# Patient Record
Sex: Female | Born: 1955 | Race: White | Hispanic: No | Marital: Married | Smoking: Former smoker
Health system: Southern US, Community
[De-identification: ages and names within clinical notes are randomized; demographics above are authoritative.]

## PROBLEM LIST (undated history)

## (undated) DIAGNOSIS — E039 Hypothyroidism, unspecified: Secondary | ICD-10-CM

## (undated) DIAGNOSIS — D649 Anemia, unspecified: Secondary | ICD-10-CM

## (undated) DIAGNOSIS — Z8719 Personal history of other diseases of the digestive system: Secondary | ICD-10-CM

## (undated) DIAGNOSIS — K649 Unspecified hemorrhoids: Secondary | ICD-10-CM

## (undated) DIAGNOSIS — K219 Gastro-esophageal reflux disease without esophagitis: Secondary | ICD-10-CM

## (undated) DIAGNOSIS — L309 Dermatitis, unspecified: Secondary | ICD-10-CM

## (undated) DIAGNOSIS — G43909 Migraine, unspecified, not intractable, without status migrainosus: Secondary | ICD-10-CM

## (undated) HISTORY — PX: DILATION AND CURETTAGE OF UTERUS: SHX78

---

## 2014-12-12 ENCOUNTER — Emergency Department: Payer: Self-pay | Admitting: Emergency Medicine

## 2016-02-28 ENCOUNTER — Other Ambulatory Visit: Payer: Self-pay | Admitting: Obstetrics and Gynecology

## 2016-02-28 DIAGNOSIS — Z1231 Encounter for screening mammogram for malignant neoplasm of breast: Secondary | ICD-10-CM

## 2016-03-15 ENCOUNTER — Ambulatory Visit
Admission: RE | Admit: 2016-03-15 | Discharge: 2016-03-15 | Disposition: A | Discharge status: Home / Self Care | Payer: BC Managed Care – PPO | Source: Ambulatory Visit | Attending: Obstetrics and Gynecology | Admitting: Obstetrics and Gynecology

## 2016-03-15 DIAGNOSIS — Z1231 Encounter for screening mammogram for malignant neoplasm of breast: Secondary | ICD-10-CM | POA: Diagnosis not present

## 2016-04-06 ENCOUNTER — Other Ambulatory Visit: Payer: Self-pay | Admitting: Obstetrics and Gynecology

## 2016-04-06 DIAGNOSIS — N6459 Other signs and symptoms in breast: Secondary | ICD-10-CM

## 2016-04-06 DIAGNOSIS — N6489 Other specified disorders of breast: Secondary | ICD-10-CM

## 2016-04-14 ENCOUNTER — Ambulatory Visit
Admission: RE | Admit: 2016-04-14 | Discharge: 2016-04-14 | Disposition: A | Discharge status: Home / Self Care | Payer: BC Managed Care – PPO | Source: Ambulatory Visit | Attending: Obstetrics and Gynecology | Admitting: Obstetrics and Gynecology

## 2016-04-14 DIAGNOSIS — N6459 Other signs and symptoms in breast: Secondary | ICD-10-CM

## 2016-04-14 DIAGNOSIS — N6489 Other specified disorders of breast: Secondary | ICD-10-CM | POA: Diagnosis not present

## 2016-04-14 DIAGNOSIS — R6889 Other general symptoms and signs: Secondary | ICD-10-CM | POA: Insufficient documentation

## 2016-08-08 ENCOUNTER — Telehealth: Payer: Self-pay | Admitting: Gastroenterology

## 2016-08-08 NOTE — Telephone Encounter (Signed)
colonoscopy

## 2016-08-10 NOTE — Telephone Encounter (Signed)
Called patient and lvm

## 2016-08-10 NOTE — Telephone Encounter (Signed)
Patient will call me back tomorrow. She is aware that I will not be here. Please document the date that the patient chose and I will add in her orders when I get back

## 2016-08-11 ENCOUNTER — Telehealth: Payer: Self-pay | Admitting: Gastroenterology

## 2016-08-11 NOTE — Telephone Encounter (Signed)
Patient stated you were waiting for this info

## 2016-08-11 NOTE — Telephone Encounter (Signed)
Patient left a voice message to confirm colonoscopy appointment for 08/24/16

## 2016-08-11 NOTE — Telephone Encounter (Signed)
Pt called and stated that she would like to have procedure done on 08/24/16.

## 2016-08-14 ENCOUNTER — Other Ambulatory Visit: Payer: Self-pay

## 2016-08-14 NOTE — Telephone Encounter (Signed)
Screening Colonoscopy  08/24/2016 [facility] Dr. Lannie Fields Pre cert is not required

## 2016-08-14 NOTE — Telephone Encounter (Signed)
Colonoscopy has been scheduled. Will call patient to confirm

## 2016-08-14 NOTE — Telephone Encounter (Signed)
Gastroenterology Pre-Procedure Review  Request Date: 08/24/2016 Requesting Physician:  PATIENT REVIEW QUESTIONS: The patient responded to the following health history questions as indicated:    1. Are you having any GI issues? yes (Hemrroids) 2. Do you have a personal history of Polyps? yes (Unknown) 3. Do you have a family history of Colon Cancer or Polyps? no 4. Diabetes Mellitus? no 5. Joint replacements in the past 12 months?no 6. Major health problems in the past 3 months?no 7. Any artificial heart valves, MVP, or defibrillator?no    MEDICATIONS & ALLERGIES:    Patient reports the following regarding taking any anticoagulation/antiplatelet therapy:   Plavix, Coumadin, Eliquis, Xarelto, Lovenox, Pradaxa, Brilinta, or Effient? no Aspirin? no  Patient confirms/reports the following medications:  Current Outpatient Prescriptions  Medication Sig Dispense Refill  . ferrous sulfate 325 (65 FE) MG tablet TAKE 1 (ONE) TABLET BY MOUTH TWO TIMES DAILY  6  . levothyroxine (SYNTHROID, LEVOTHROID) 50 MCG tablet Take 50 mcg by mouth daily.  5   No current facility-administered medications for this visit.     Patient confirms/reports the following allergies:  Allergies  Allergen Reactions  . Latex Hives  . Penicillins Hives  . Sulfa Antibiotics Hives    No orders of the defined types were placed in this encounter.   AUTHORIZATION INFORMATION Primary Insurance: 1D#: Group #:  Secondary Insurance: 1D#: Group #:  SCHEDULE INFORMATION: Date: 08/24/2016 Time: Location: [facility]

## 2016-08-16 ENCOUNTER — Encounter: Payer: Self-pay | Admitting: *Deleted

## 2016-08-24 ENCOUNTER — Ambulatory Visit: Payer: BC Managed Care – PPO | Admitting: Anesthesiology

## 2016-08-24 ENCOUNTER — Ambulatory Visit
Admission: RE | Admit: 2016-08-24 | Discharge: 2016-08-24 | Disposition: A | Discharge status: Home / Self Care | Payer: BC Managed Care – PPO | Source: Ambulatory Visit | Attending: Gastroenterology | Admitting: Gastroenterology

## 2016-08-24 ENCOUNTER — Encounter
Admission: RE | Disposition: A | Discharge status: Home / Self Care | Payer: Self-pay | Source: Ambulatory Visit | Attending: Gastroenterology

## 2016-08-24 DIAGNOSIS — D124 Benign neoplasm of descending colon: Secondary | ICD-10-CM | POA: Diagnosis not present

## 2016-08-24 DIAGNOSIS — Z860100 Personal history of colon polyps, unspecified: Secondary | ICD-10-CM

## 2016-08-24 DIAGNOSIS — D125 Benign neoplasm of sigmoid colon: Secondary | ICD-10-CM | POA: Diagnosis not present

## 2016-08-24 DIAGNOSIS — K648 Other hemorrhoids: Secondary | ICD-10-CM | POA: Insufficient documentation

## 2016-08-24 DIAGNOSIS — Z1211 Encounter for screening for malignant neoplasm of colon: Secondary | ICD-10-CM | POA: Insufficient documentation

## 2016-08-24 DIAGNOSIS — E039 Hypothyroidism, unspecified: Secondary | ICD-10-CM | POA: Diagnosis not present

## 2016-08-24 DIAGNOSIS — K635 Polyp of colon: Secondary | ICD-10-CM

## 2016-08-24 HISTORY — PX: POLYPECTOMY: SHX5525

## 2016-08-24 HISTORY — DX: Personal history of other diseases of the digestive system: Z87.19

## 2016-08-24 HISTORY — DX: Dermatitis, unspecified: L30.9

## 2016-08-24 HISTORY — DX: Anemia, unspecified: D64.9

## 2016-08-24 HISTORY — PX: COLONOSCOPY WITH PROPOFOL: SHX5780

## 2016-08-24 HISTORY — DX: Migraine, unspecified, not intractable, without status migrainosus: G43.909

## 2016-08-24 SURGERY — COLONOSCOPY WITH PROPOFOL
Anesthesia: Monitor Anesthesia Care | Wound class: Contaminated

## 2016-08-24 MED ORDER — PROPOFOL 10 MG/ML IV BOLUS
INTRAVENOUS | Status: DC | PRN
  Administered 2016-08-24: 30 mg via INTRAVENOUS
  Administered 2016-08-24: 50 mg via INTRAVENOUS
  Administered 2016-08-24: 120 mg via INTRAVENOUS

## 2016-08-24 MED ORDER — ONDANSETRON HCL 4 MG/2ML IJ SOLN: 4.0000 mg | Freq: Once | INTRAMUSCULAR | Status: DC | PRN

## 2016-08-24 MED ORDER — LACTATED RINGERS IV SOLN
INTRAVENOUS | Status: DC
  Administered 2016-08-24: 09:00:00 via INTRAVENOUS

## 2016-08-24 MED ORDER — LIDOCAINE HCL (CARDIAC) 20 MG/ML IV SOLN
INTRAVENOUS | Status: DC | PRN
  Administered 2016-08-24: 50 mg via INTRAVENOUS

## 2016-08-24 MED ORDER — ACETAMINOPHEN 160 MG/5ML PO SOLN: 325.0000 mg | ORAL | Status: DC | PRN

## 2016-08-24 MED ORDER — ACETAMINOPHEN 325 MG PO TABS: 325.0000 mg | ORAL_TABLET | ORAL | Status: DC | PRN

## 2016-08-24 MED ORDER — STERILE WATER FOR IRRIGATION IR SOLN
Status: DC | PRN
  Administered 2016-08-24: 10:00:00

## 2016-08-24 SURGICAL SUPPLY — 6 items
CANISTER SUCT 1200ML W/VALVE (MISCELLANEOUS) ×4 IMPLANT
FORCEPS BIOP RAD 4 LRG CAP 4 (CUTTING FORCEPS) ×4 IMPLANT
GOWN CVR UNV OPN BCK APRN NK (MISCELLANEOUS) ×4 IMPLANT
GOWN ISOL THUMB LOOP REG UNIV (MISCELLANEOUS) ×4
KIT ENDO PROCEDURE OLY (KITS) ×4 IMPLANT
WATER STERILE IRR 250ML POUR (IV SOLUTION) ×4 IMPLANT

## 2016-08-24 NOTE — Discharge Instructions (Signed)

## 2016-08-24 NOTE — Op Note (Signed)
[facility] Gastroenterology Patient Name: Barbara Walter Procedure Date: 08/24/2016 9:56 AM MRN: [id number] Account #: [id number] Date of Birth: 1956/04/24 Admit Type: Outpatient Age: 60 Room: [facility] OR ROOM 01 Gender: Female Note Status: Finalized Procedure:            Colonoscopy Indications:          High risk colon cancer surveillance: Personal history                        of colonic polyps Providers:            Lucilla Lame MD, MD Medicines:            Propofol per Anesthesia Complications:        No immediate complications. Procedure:            Pre-Anesthesia Assessment:                       - Prior to the procedure, a History and Physical was                        performed, and patient medications and allergies were                        reviewed. The patient's tolerance of previous                        anesthesia was also reviewed. The risks and benefits of                        the procedure and the sedation options and risks were                        discussed with the patient. All questions were                        answered, and informed consent was obtained. Prior                        Anticoagulants: The patient has taken no previous                        anticoagulant or antiplatelet agents. ASA Grade                        Assessment: II - A patient with mild systemic disease.                        After reviewing the risks and benefits, the patient was                        deemed in satisfactory condition to undergo the                        procedure.                       After obtaining informed consent, the colonoscope was                        passed under direct vision. Throughout the  procedure,                        the patient's blood pressure, pulse, and oxygen                        saturations were monitored continuously. The was                        introduced through the anus and advanced to the the          cecum, identified by appendiceal orifice and ileocecal                        valve. The colonoscopy was performed without                        difficulty. The patient tolerated the procedure well.                        The quality of the bowel preparation was excellent. Findings:      The perianal and digital rectal examinations were normal.      A 3 mm polyp was found in the descending colon. The polyp was sessile.       The polyp was removed with a cold biopsy forceps. Resection and       retrieval were complete.      A 3 mm polyp was found in the sigmoid colon. The polyp was sessile. The       polyp was removed with a cold biopsy forceps. Resection and retrieval       were complete.      Non-bleeding internal hemorrhoids were found during retroflexion. The       hemorrhoids were Grade I (internal hemorrhoids that do not prolapse). Impression:           - One 3 mm polyp in the descending colon, removed with                        a cold biopsy forceps. Resected and retrieved.                       - One 3 mm polyp in the sigmoid colon, removed with a                        cold biopsy forceps. Resected and retrieved.                       - Non-bleeding internal hemorrhoids. Recommendation:       - Discharge patient to home.                       - Resume previous diet.                       - Await pathology results.                       - Repeat colonoscopy in 5 years for surveillance. Procedure Code(s):    --- Professional ---                       [phone], Colonoscopy, flexible; with  biopsy, single or                        multiple Diagnosis Code(s):    --- Professional ---                       Z86.010, Personal history of colonic polyps                       D12.4, Benign neoplasm of descending colon                       D12.5, Benign neoplasm of sigmoid colon CPT copyright 2016 American Medical Association. All rights reserved. The codes documented in this report  are preliminary and upon coder review may  be revised to meet current compliance requirements. Lucilla Lame MD, MD 08/24/2016 10:22:44 AM This report has been signed electronically. Number of Addenda: 0 Note Initiated On: 08/24/2016 9:56 AM Scope Withdrawal Time: 0 hours 7 minutes 47 seconds  Total Procedure Duration: 0 hours 10 minutes 46 seconds       [facility]

## 2016-08-24 NOTE — Transfer of Care (Signed)
Immediate Anesthesia Transfer of Care Note  Patient: Kynslie Zuch  Procedure(s) Performed: Procedure(s) with comments: COLONOSCOPY WITH PROPOFOL (N/A) - Latex sensitivity POLYPECTOMY  Patient Location: PACU  Anesthesia Type: MAC  Level of Consciousness: awake, alert  and patient cooperative  Airway and Oxygen Therapy: Patient Spontanous Breathing and Patient connected to supplemental oxygen  Post-op Assessment: Post-op Vital signs reviewed, Patient's Cardiovascular Status Stable, Respiratory Function Stable, Patent Airway and No signs of Nausea or vomiting  Post-op Vital Signs: Reviewed and stable  Complications: No apparent anesthesia complications

## 2016-08-24 NOTE — Anesthesia Postprocedure Evaluation (Signed)
Anesthesia Post Note  Patient: Barbara Walter  Procedure(s) Performed: Procedure(s) (LRB): COLONOSCOPY WITH PROPOFOL (N/A) POLYPECTOMY  Patient location during evaluation: PACU Anesthesia Type: MAC Level of consciousness: awake and alert and oriented Pain management: pain level controlled Vital Signs Assessment: post-procedure vital signs reviewed and stable Respiratory status: spontaneous breathing and nonlabored ventilation Cardiovascular status: stable Postop Assessment: no signs of nausea or vomiting and adequate PO intake Anesthetic complications: no    Estill Batten

## 2016-08-24 NOTE — H&P (Signed)
  Lucilla Lame, MD [facility] [address], [place], [place] [address] Phone: [phone] Fax : [phone]  Primary Care Physician:  No primary care provider on file. Primary Gastroenterologist:  Dr. Allen Norris  Pre-Procedure History & Physical: HPI:  Barbara Walter is a 60 y.o. female is here for an colonoscopy.   Past Medical History:  Diagnosis Date  . Anemia   . Eczema   . History of hiatal hernia   . Migraines    food and stress induced    Past Surgical History:  Procedure Laterality Date  . DILATION AND CURETTAGE OF UTERUS      Prior to Admission medications   Medication Sig Start Date End Date Taking? Authorizing Provider  cetirizine (ZYRTEC) 10 MG tablet Take 10 mg by mouth daily.   Yes Historical Provider, MD  ferrous sulfate 325 (65 FE) MG tablet TAKE 1 (ONE) TABLET BY MOUTH TWO TIMES DAILY 08/04/16  Yes Historical Provider, MD  levothyroxine (SYNTHROID, LEVOTHROID) 50 MCG tablet Take 50 mcg by mouth daily. 08/04/16  Yes Historical Provider, MD    Allergies as of 08/14/2016 - never reviewed  Allergen Reaction Noted  . Latex Hives 08/10/2016  . Penicillins Hives 08/10/2016  . Sulfa antibiotics Hives 08/10/2016    Family History  Problem Relation Age of Onset  . Breast cancer Maternal Grandmother     Social History   Social History  . Marital status: Unknown    Spouse name: N/A  . Number of children: N/A  . Years of education: N/A   Occupational History  . Not on file.   Social History Main Topics  . Smoking status: Never Smoker  . Smokeless tobacco: Never Used  . Alcohol use No  . Drug use: Unknown  . Sexual activity: Not on file   Other Topics Concern  . Not on file   Social History Narrative  . No narrative on file    Review of Systems: See HPI, otherwise negative ROS  Physical Exam: BP 122/82   Pulse 77   Temp 97.3 F (36.3 C) (Tympanic)   Resp 16   Ht 5\' 4"  (1.626 m)   Wt 153 lb (69.4 kg)   SpO2 98%   BMI 26.26 kg/m  General:    Alert,  pleasant and cooperative in NAD Head:  Normocephalic and atraumatic. Neck:  Supple; no masses or thyromegaly. Lungs:  Clear throughout to auscultation.    Heart:  Regular rate and rhythm. Abdomen:  Soft, nontender and nondistended. Normal bowel sounds, without guarding, and without rebound.   Neurologic:  Alert and  oriented x4;  grossly normal neurologically.  Impression/Plan: Barbara Walter is here for an colonoscopy to be performed for history of colon polyps  Risks, benefits, limitations, and alternatives regarding  colonoscopy have been reviewed with the patient.  Questions have been answered.  All parties agreeable.   Lucilla Lame, MD  08/24/2016, 9:58 AM

## 2016-08-24 NOTE — Anesthesia Preprocedure Evaluation (Addendum)
Anesthesia Evaluation  Patient identified by MRN, date of birth, ID band Patient awake    Reviewed: Allergy & Precautions, NPO status , Patient's Chart, lab work & pertinent test results  Airway Mallampati: I  TM Distance: >3 FB Neck ROM: Full    Dental no notable dental hx.    Pulmonary neg pulmonary ROS,    Pulmonary exam normal        Cardiovascular negative cardio ROS Normal cardiovascular exam     Neuro/Psych  Headaches, negative psych ROS   GI/Hepatic Neg liver ROS, hiatal hernia,   Endo/Other  Hypothyroidism   Renal/GU negative Renal ROS     Musculoskeletal negative musculoskeletal ROS (+)   Abdominal   Peds  Hematology negative hematology ROS (+)   Anesthesia Other Findings   Reproductive/Obstetrics                            Anesthesia Physical Anesthesia Plan  ASA: II  Anesthesia Plan: MAC   Post-op Pain Management:    Induction: Intravenous  Airway Management Planned:   Additional Equipment:   Intra-op Plan:   Post-operative Plan:   Informed Consent: I have reviewed the patients History and Physical, chart, labs and discussed the procedure including the risks, benefits and alternatives for the proposed anesthesia with the patient or authorized representative who has indicated his/her understanding and acceptance.     Plan Discussed with: CRNA  Anesthesia Plan Comments:         Anesthesia Quick Evaluation

## 2016-08-24 NOTE — Anesthesia Procedure Notes (Signed)
Procedure Name: MAC Date/Time: 08/24/2016 10:04 AM Performed by: Janna Arch Pre-anesthesia Checklist: Patient identified, Emergency Drugs available, Suction available and Patient being monitored Patient Re-evaluated:Patient Re-evaluated prior to inductionOxygen Delivery Method: Nasal cannula

## 2016-08-25 ENCOUNTER — Encounter: Payer: Self-pay | Admitting: Gastroenterology

## 2016-08-31 ENCOUNTER — Encounter: Payer: Self-pay | Admitting: Gastroenterology

## 2016-09-03 ENCOUNTER — Encounter: Payer: Self-pay | Admitting: Gastroenterology

## 2016-11-08 ENCOUNTER — Ambulatory Visit (INDEPENDENT_AMBULATORY_CARE_PROVIDER_SITE_OTHER): Payer: BC Managed Care – PPO

## 2016-11-08 ENCOUNTER — Encounter: Payer: Self-pay | Admitting: Podiatry

## 2016-11-08 ENCOUNTER — Ambulatory Visit (INDEPENDENT_AMBULATORY_CARE_PROVIDER_SITE_OTHER): Payer: BC Managed Care – PPO | Admitting: Podiatry

## 2016-11-08 VITALS — BP 129/86 | HR 73 | Resp 16

## 2016-11-08 DIAGNOSIS — M79672 Pain in left foot: Secondary | ICD-10-CM

## 2016-11-08 DIAGNOSIS — M7752 Other enthesopathy of left foot: Secondary | ICD-10-CM

## 2016-11-08 DIAGNOSIS — M84375A Stress fracture, left foot, initial encounter for fracture: Secondary | ICD-10-CM

## 2016-11-08 DIAGNOSIS — M779 Enthesopathy, unspecified: Secondary | ICD-10-CM

## 2016-11-08 DIAGNOSIS — M778 Other enthesopathies, not elsewhere classified: Secondary | ICD-10-CM

## 2016-11-08 NOTE — Progress Notes (Signed)
   Subjective:    Patient ID: Barbara Walter, female    DOB: 1956-05-01, 61 y.o.   MRN: [id number]  HPI: She presents today with a chief complaint of pain to the dorsal lateral aspect of the left foot 2 months. she states that she remembers stepping down hard on the foot while she was walking her new copy. She states that she's had some swelling and it aches from time to time she states that it doesn't hurt all the time nor doesn't hurt every day. She states that it feels best in her snow boots.    Review of Systems  Skin: Positive for rash.  Allergic/Immunologic: Positive for food allergies.  All other systems reviewed and are negative.      Objective:   Physical Exam: Vital signs are stable alert and oriented 3. Pulses are palpable. Neurologic sensorium is intact. Deep tendon reflexes are intact. She has pain on palpation of the base of the fifth metatarsal. There is a small ridge that runs cross dorsal aspect of the fifth metatarsal on palpation not visible on radiograph. She states this is the point tenderness. Radiographs do not demonstrate any type of fracture or dislocation.        Assessment & Plan:  Probable fifth metatarsal stress fracture slowly healing cells as capsulitis of that joint.  Plan: I injected the area today with eczema as a local anesthetic since assessment 2 months and she still has some tenderness. Follow-up with her as needed.

## 2017-03-13 ENCOUNTER — Ambulatory Visit: Payer: BC Managed Care – PPO | Admitting: Obstetrics and Gynecology

## 2017-05-16 ENCOUNTER — Ambulatory Visit (INDEPENDENT_AMBULATORY_CARE_PROVIDER_SITE_OTHER): Payer: BC Managed Care – PPO | Admitting: Obstetrics and Gynecology

## 2017-05-16 ENCOUNTER — Encounter: Payer: Self-pay | Admitting: Obstetrics and Gynecology

## 2017-05-16 VITALS — BP 132/80 | HR 73 | Ht 64.0 in | Wt 153.0 lb

## 2017-05-16 DIAGNOSIS — Z1231 Encounter for screening mammogram for malignant neoplasm of breast: Secondary | ICD-10-CM | POA: Diagnosis not present

## 2017-05-16 DIAGNOSIS — N9419 Other specified dyspareunia: Secondary | ICD-10-CM

## 2017-05-16 DIAGNOSIS — Z01419 Encounter for gynecological examination (general) (routine) without abnormal findings: Secondary | ICD-10-CM

## 2017-05-16 DIAGNOSIS — Z124 Encounter for screening for malignant neoplasm of cervix: Secondary | ICD-10-CM | POA: Diagnosis not present

## 2017-05-16 DIAGNOSIS — Z1239 Encounter for other screening for malignant neoplasm of breast: Secondary | ICD-10-CM

## 2017-05-16 LAB — HM PAP SMEAR: HM Pap smear: NORMAL

## 2017-05-16 MED ORDER — PRASTERONE 6.5 MG VA INST: 6.5000 mg | VAGINAL_INSERT | Freq: Every day | VAGINAL | 11 refills | Status: DC

## 2017-05-16 NOTE — Patient Instructions (Signed)
Preventive Care 40-64 Years, Female Preventive care refers to lifestyle choices and visits with your health care provider that can promote health and wellness. What does preventive care include?  A yearly physical exam. This is also called an annual well check.  Dental exams once or twice a year.  Routine eye exams. Ask your health care provider how often you should have your eyes checked.  Personal lifestyle choices, including: ? Daily care of your teeth and gums. ? Regular physical activity. ? Eating a healthy diet. ? Avoiding tobacco and drug use. ? Limiting alcohol use. ? Practicing safe sex. ? Taking low-dose aspirin daily starting at age 58. ? Taking vitamin and mineral supplements as recommended by your health care provider. What happens during an annual well check? The services and screenings done by your health care provider during your annual well check will depend on your age, overall health, lifestyle risk factors, and family history of disease. Counseling Your health care provider may ask you questions about your:  Alcohol use.  Tobacco use.  Drug use.  Emotional well-being.  Home and relationship well-being.  Sexual activity.  Eating habits.  Work and work Statistician.  Method of birth control.  Menstrual cycle.  Pregnancy history.  Screening You may have the following tests or measurements:  Height, weight, and BMI.  Blood pressure.  Lipid and cholesterol levels. These may be checked every 5 years, or more frequently if you are over 81 years old.  Skin check.  Lung cancer screening. You may have this screening every year starting at age 78 if you have a 30-pack-year history of smoking and currently smoke or have quit within the past 15 years.  Fecal occult blood test (FOBT) of the stool. You may have this test every year starting at age 65.  Flexible sigmoidoscopy or colonoscopy. You may have a sigmoidoscopy every 5 years or a colonoscopy  every 10 years starting at age 30.  Hepatitis C blood test.  Hepatitis B blood test.  Sexually transmitted disease (STD) testing.  Diabetes screening. This is done by checking your blood sugar (glucose) after you have not eaten for a while (fasting). You may have this done every 1-3 years.  Mammogram. This may be done every 1-2 years. Talk to your health care provider about when you should start having regular mammograms. This may depend on whether you have a family history of breast cancer.  BRCA-related cancer screening. This may be done if you have a family history of breast, ovarian, tubal, or peritoneal cancers.  Pelvic exam and Pap test. This may be done every 3 years starting at age 80. Starting at age 36, this may be done every 5 years if you have a Pap test in combination with an HPV test.  Bone density scan. This is done to screen for osteoporosis. You may have this scan if you are at high risk for osteoporosis.  Discuss your test results, treatment options, and if necessary, the need for more tests with your health care provider. Vaccines Your health care provider may recommend certain vaccines, such as:  Influenza vaccine. This is recommended every year.  Tetanus, diphtheria, and acellular pertussis (Tdap, Td) vaccine. You may need a Td booster every 10 years.  Varicella vaccine. You may need this if you have not been vaccinated.  Zoster vaccine. You may need this after age 5.  Measles, mumps, and rubella (MMR) vaccine. You may need at least one dose of MMR if you were born in  1956 or later. You may also need a second dose.  Pneumococcal 13-valent conjugate (PCV13) vaccine. You may need this if you have certain conditions and were not previously vaccinated.  Pneumococcal polysaccharide (PPSV23) vaccine. You may need one or two doses if you smoke cigarettes or if you have certain conditions.  Meningococcal vaccine. You may need this if you have certain  conditions.  Hepatitis A vaccine. You may need this if you have certain conditions or if you travel or work in places where you may be exposed to hepatitis A.  Hepatitis B vaccine. You may need this if you have certain conditions or if you travel or work in places where you may be exposed to hepatitis B.  Haemophilus influenzae type b (Hib) vaccine. You may need this if you have certain conditions.  Talk to your health care provider about which screenings and vaccines you need and how often you need them. This information is not intended to replace advice given to you by your health care provider. Make sure you discuss any questions you have with your health care provider. Document Released: 10/29/2015 Document Revised: 06/21/2016 Document Reviewed: 08/03/2015 Elsevier Interactive Patient Education  2017 [employer].

## 2017-05-16 NOTE — Progress Notes (Signed)
Patient ID: Barbara Walter, female   DOB: March 26, 1956, 61 y.o.   MRN: [id number]     Gynecology Annual Exam  PCP: Cletis Athens, MD  Chief Complaint:  Chief Complaint  Patient presents with  . Gynecologic Exam    Dx w/hypothyroid    History of Present Illness:Patient is a 61 y.o. G1P0010 presents for annual exam. The patient has no complaints today.   LMP: No LMP recorded. Patient is postmenopausal. No vaginal bleeding or spotting  The patient is sexually active. She reports dyspareunia, was trailed on premarin last year but no improvement.  The patient does perform self breast exams.  There is no notable family history of breast or ovarian cancer in her family.  The patient wears seatbelts: yes.   The patient has regular exercise: not asked.    The patient denies current symptoms of depression.     Review of Systems: Review of Systems  Constitutional: Negative for chills and fever.  HENT: Negative for congestion.   Respiratory: Negative for cough and shortness of breath.   Cardiovascular: Negative for chest pain and palpitations.  Gastrointestinal: Negative for abdominal pain, constipation, diarrhea, heartburn, nausea and vomiting.  Genitourinary: Negative for dysuria, frequency and urgency.  Skin: Negative for itching and rash.  Neurological: Negative for dizziness and headaches.  Endo/Heme/Allergies: Negative for polydipsia.  Psychiatric/Behavioral: Negative for depression.    Past Medical History:  Past Medical History:  Diagnosis Date  . Anemia   . Eczema   . History of hiatal hernia   . Migraines    food and stress induced    Past Surgical History:  Past Surgical History:  Procedure Laterality Date  . COLONOSCOPY WITH PROPOFOL N/A 08/24/2016   Procedure: COLONOSCOPY WITH PROPOFOL;  Surgeon: Lucilla Lame, MD;  Location: [place];  Service: Endoscopy;  Laterality: N/A;  Latex sensitivity  . DILATION AND CURETTAGE OF UTERUS    . POLYPECTOMY  08/24/2016   Procedure: POLYPECTOMY;  Surgeon: Lucilla Lame, MD;  Location: [place];  Service: Endoscopy;;    Gynecologic History:  No LMP recorded. Patient is postmenopausal. Last Pap: Results were: 03/10/16 NIL and HR HPV negative  Last mammogram: 03/15/16 Results were: BI-RAD I Obstetric History: G1P0010  Family History:  Family History  Problem Relation Age of Onset  . Breast cancer Maternal Grandmother     Social History:  Social History   Social History  . Marital status: Unknown    Spouse name: N/A  . Number of children: N/A  . Years of education: N/A   Occupational History  . Not on file.   Social History Main Topics  . Smoking status: Never Smoker  . Smokeless tobacco: Never Used  . Alcohol use No  . Drug use: No  . Sexual activity: Yes    Birth control/ protection: None   Other Topics Concern  . Not on file   Social History Narrative  . No narrative on file    Allergies:  Allergies  Allergen Reactions  . Penicillins Hives  . Sulfa Antibiotics Hives  . Latex Hives    Diaphragm, gloves  . Milk-Related Compounds Nausea Only    Dairy - stomach ache    Medications: Prior to Admission medications   Medication Sig Start Date End Date Taking? Authorizing Provider  cetirizine (ZYRTEC) 10 MG tablet Take 10 mg by mouth daily.   Yes [provider]  ferrous sulfate 325 (65 FE) MG tablet TAKE 1 (ONE) TABLET BY MOUTH TWO TIMES DAILY 08/04/16  Yes [provider]  levothyroxine (SYNTHROID, LEVOTHROID) 50 MCG tablet Take 50 mcg by mouth daily. 08/04/16  Yes [provider]    Physical Exam Vitals: Blood pressure 132/80, pulse 73, height 5\' 4"  (1.626 m), weight 153 lb (69.4 kg).  General: NAD HEENT: normocephalic, anicteric Thyroid: no enlargement, no palpable nodules Pulmonary: No increased work of breathing, CTAB Cardiovascular: RRR, distal pulses 2+ Breast: Breast symmetrical, no tenderness, no palpable nodules or masses, no skin  or nipple retraction present, no nipple discharge.  No axillary or supraclavicular lymphadenopathy. Abdomen: NABS, soft, non-tender, non-distended.  Umbilicus without lesions.  No hepatomegaly, splenomegaly or masses palpable. No evidence of hernia  Genitourinary:  External: Normal external female genitalia.  Normal urethral meatus, normal  Bartholin's and Skene's glands.    Vagina: Normal vaginal mucosa, no evidence of prolapse.    Cervix: Grossly normal in appearance, no bleeding  Uterus: Non-enlarged, mobile, normal contour.  No CMT  Adnexa: ovaries non-enlarged, no adnexal masses  Rectal: deferred  Lymphatic: no evidence of inguinal lymphadenopathy Extremities: no edema, erythema, or tenderness Neurologic: Grossly intact Psychiatric: mood appropriate, affect full  Female chaperone present for pelvic and breast  portions of the physical exam    Assessment: 61 y.o. G1P0010 routine annual exam  Plan: Problem List Items Addressed This Visit    None    Visit Diagnoses    Dyspareunia due to medical condition in female    -  Primary   Screening for malignant neoplasm of cervix       Relevant Orders   PapIG, HPV, rfx 16/18   Breast screening       Relevant Orders   MM DIGITAL SCREENING BILATERAL   Encounter for gynecological examination without abnormal finding       Relevant Orders   PapIG, HPV, rfx 16/18      1) Mammogram - recommend yearly screening mammogram.  Mammogram Was ordered today  2) STI screening was offered and declined  3) ASCCP guidelines and rational discussed.  Patient opts for yearly screening interval  4) Osteoporosis  - per USPTF routine screening DEXA at age 60  5) Routine healthcare maintenance including cholesterol, diabetes screening discussed managed by PCP  6) Colonoscopy - Screening recommended starting at age 12 for average risk individuals, age 1 for individuals deemed at increased risk (including African Americans) and recommended to  continue until age 28.  For patient age 24-85 individualized approach is recommended.  Gold standard screening is via colonoscopy, Cologuard screening is an acceptable alternative for patient unwilling or unable to undergo colonoscopy.  "Colorectal cancer screening for average?risk adults: 2018 guideline update from the American Cancer Society"CA: A Cancer Journal for Clinicians: Mar 14, 2017  - up to date  7) Dysparunia - trial on intrarosa  8) Follow up 1 year for routine annual

## 2017-05-19 LAB — PAPIG, HPV, RFX 16/18: PAP SMEAR COMMENT: 0

## 2017-05-19 LAB — HPV, LOW VOLUME (REFLEX): HPV, LOW VOL REFLEX: NEGATIVE

## 2017-05-21 ENCOUNTER — Encounter: Payer: Self-pay | Admitting: Obstetrics and Gynecology

## 2017-06-04 ENCOUNTER — Ambulatory Visit
Admission: RE | Admit: 2017-06-04 | Discharge: 2017-06-04 | Disposition: A | Discharge status: Home / Self Care | Payer: BC Managed Care – PPO | Source: Ambulatory Visit | Attending: Obstetrics and Gynecology | Admitting: Obstetrics and Gynecology

## 2017-06-04 DIAGNOSIS — Z1231 Encounter for screening mammogram for malignant neoplasm of breast: Secondary | ICD-10-CM | POA: Insufficient documentation

## 2017-06-04 DIAGNOSIS — Z1239 Encounter for other screening for malignant neoplasm of breast: Secondary | ICD-10-CM

## 2017-06-06 ENCOUNTER — Encounter: Payer: Self-pay | Admitting: Obstetrics and Gynecology

## 2017-06-27 ENCOUNTER — Encounter: Payer: Self-pay | Admitting: Podiatry

## 2017-06-27 ENCOUNTER — Ambulatory Visit (INDEPENDENT_AMBULATORY_CARE_PROVIDER_SITE_OTHER): Payer: BC Managed Care – PPO | Admitting: Podiatry

## 2017-06-27 DIAGNOSIS — M7752 Other enthesopathy of left foot: Secondary | ICD-10-CM | POA: Diagnosis not present

## 2017-06-27 DIAGNOSIS — M778 Other enthesopathies, not elsewhere classified: Secondary | ICD-10-CM

## 2017-06-27 DIAGNOSIS — M779 Enthesopathy, unspecified: Principal | ICD-10-CM

## 2017-06-27 NOTE — Progress Notes (Signed)
She presents today for follow-up of capsulitis. States this been a while but has done very good she states it started hurting again right ovary. She points to the fifth metatarsal base of the left foot. Last time she was in her os perineum was hurting her and we injected her at that point.  Objective: Vital signs are stable alert and oriented 3. Pulses are palpable. She has pain on direct palpation of the fifth metatarsal base. There does appear to be a fluctuance and soft tissue plantar lateral with a likely bursitis.  Assessment: Bursitis capsulitis.  Plan: Injected the area today with dexamethasone and local anesthetic or Kenalog and local anesthetic. Discussed shoe gear follow up with her as needed. May need to consider MRI next visit.

## 2017-07-23 ENCOUNTER — Telehealth: Payer: Self-pay

## 2017-07-23 NOTE — Telephone Encounter (Signed)
Pt has a drying and narrowing problem AMS is helping her c.  She doesn't like the prasterone and would like to go back on what she had before.  [facility] [address] [address]  [phone]

## 2017-07-23 NOTE — Telephone Encounter (Signed)
Please advise 

## 2017-07-24 ENCOUNTER — Other Ambulatory Visit: Payer: Self-pay | Admitting: Obstetrics and Gynecology

## 2017-07-24 MED ORDER — ESTRADIOL 0.1 MG/GM VA CREA: TOPICAL_CREAM | VAGINAL | 0 refills | Status: DC

## 2017-07-24 NOTE — Telephone Encounter (Signed)
Rx for estrace has been sent to patient's pharmacy on file

## 2017-07-31 ENCOUNTER — Other Ambulatory Visit: Payer: Self-pay | Admitting: Obstetrics and Gynecology

## 2017-07-31 MED ORDER — ESTROGENS, CONJUGATED 0.625 MG/GM VA CREA: 1.0000 | TOPICAL_CREAM | VAGINAL | 3 refills | Status: DC

## 2017-07-31 NOTE — Telephone Encounter (Signed)
Pt states she would like to go back on premarin. Says she was given something else and does not like it. Please send to [facility] [address] [address]. Thank you.

## 2017-07-31 NOTE — Telephone Encounter (Signed)
Has been sent earlier today

## 2017-08-01 NOTE — Telephone Encounter (Signed)
Pt aware.

## 2018-01-14 ENCOUNTER — Encounter: Payer: Self-pay | Admitting: Obstetrics and Gynecology

## 2018-01-14 ENCOUNTER — Ambulatory Visit: Payer: BC Managed Care – PPO | Admitting: Obstetrics and Gynecology

## 2018-01-14 VITALS — BP 130/60 | Ht 63.0 in | Wt 155.0 lb

## 2018-01-14 DIAGNOSIS — N950 Postmenopausal bleeding: Secondary | ICD-10-CM

## 2018-01-14 NOTE — Progress Notes (Signed)
Gynecology H&P  Chief Complaint:  Chief Complaint  Patient presents with  . post menopausal spotting    spotting x 2 days    History of Present Illness: Patient is a 62 y.o. G1P0010 presents evaluation of postmenopausal bleeding. The patient states she has had a single episode(s) of bleeding in the past 1 day(s).  The most recent episode occurred  1  day(s) ago.  The bleeding has been limited to spotting . She describes the blood as Bright red in appearance.  She has had no additional complaints. She deniestrauma or other inciting event, bloating and abdominal pain. She does not have a history of abnormal pap smears.  Her last pap smear was8 months ago and was read as NIL and HR HPV negative .  The patient is currently sexually active with no postcoital bleeding noted There are no other aggravating factors reported. There are no alleviating factors reported. The patient's past medical history is noncontributory.   She has nothad prior work up for postmenopausal bleeding.  Review of Systems: 10 point review of systems negative unless otherwise noted in HPI  Past Medical History:  Past Medical History:  Diagnosis Date  . Anemia   . Eczema   . History of hiatal hernia   . Migraines    food and stress induced    Past Surgical History:  Past Surgical History:  Procedure Laterality Date  . COLONOSCOPY WITH PROPOFOL N/A 08/24/2016   Procedure: COLONOSCOPY WITH PROPOFOL;  Surgeon: Lucilla Lame, MD;  Location: [place];  Service: Endoscopy;  Laterality: N/A;  Latex sensitivity  . DILATION AND CURETTAGE OF UTERUS    . POLYPECTOMY  08/24/2016   Procedure: POLYPECTOMY;  Surgeon: Lucilla Lame, MD;  Location: [place];  Service: Endoscopy;;    Family History:  Family History  Problem Relation Age of Onset  . Breast cancer Maternal Grandmother     Social History:  Social History   Socioeconomic History  . Marital status: Married    Spouse name: Not on file  .  Number of children: Not on file  . Years of education: Not on file  . Highest education level: Not on file  Occupational History  . Not on file  Social Needs  . Financial resource strain: Not on file  . Food insecurity:    Worry: Not on file    Inability: Not on file  . Transportation needs:    Medical: Not on file    Non-medical: Not on file  Tobacco Use  . Smoking status: Never Smoker  . Smokeless tobacco: Never Used  Substance and Sexual Activity  . Alcohol use: No  . Drug use: No  . Sexual activity: Yes    Birth control/protection: None  Lifestyle  . Physical activity:    Days per week: Not on file    Minutes per session: Not on file  . Stress: Not on file  Relationships  . Social connections:    Talks on phone: Not on file    Gets together: Not on file    Attends religious service: Not on file    Active member of club or organization: Not on file    Attends meetings of clubs or organizations: Not on file    Relationship status: Not on file  . Intimate partner violence:    Fear of current or ex partner: Not on file    Emotionally abused: Not on file    Physically abused: Not on file  Forced sexual activity: Not on file  Other Topics Concern  . Not on file  Social History Narrative  . Not on file    Allergies:  Allergies  Allergen Reactions  . Penicillins Hives  . Sulfa Antibiotics Hives  . Latex Hives    Diaphragm, gloves  . Milk-Related Compounds Nausea Only    Dairy - stomach ache    Medications: Prior to Admission medications   Medication Sig Start Date End Date Taking? Authorizing Provider  cetirizine (ZYRTEC) 10 MG tablet Take 10 mg by mouth daily.    [provider]  conjugated estrogens (PREMARIN) vaginal cream Place 1 Applicatorful vaginally 2 (two) times a week. 08/02/17   Malachy Mood, MD  ferrous sulfate 325 (65 FE) MG tablet TAKE 1 (ONE) TABLET BY MOUTH TWO TIMES DAILY 08/04/16   [provider]  levothyroxine  (SYNTHROID, LEVOTHROID) 50 MCG tablet Take 50 mcg by mouth daily. 08/04/16   [provider]    Physical Exam Vitals: There were no vitals taken for this visit.  General: NAD HEENT: normocephalic, anicteric Pulmonary: No increased work of breathing Genitourinary:  External: Normal external female genitalia.  Normal urethral meatus, normal  Bartholin's and Skene's glands.    Vagina: Normal vaginal mucosa, no evidence of prolapse. Small area posterior vaginal wall with some adherent blood approximately 46mm, no surrounding abnormal tissue changes   Cervix: Grossly normal in appearance, no active bleeding  Uterus: Non-enlarged, mobile, normal contour.  No CMT  Adnexa: ovaries non-enlarged, no adnexal masses  Rectal: deferred Extremities: no edema, erythema, or tenderness Neurologic: Grossly intact Psychiatric: mood appropriate, affect full  Assessment: 62 y.o. G1P0010 presenting for evaluation of postmenopausal bleeding  Plan: Problem List Items Addressed This Visit    None    Visit Diagnoses    Postmenopausal bleeding    -  Primary   Relevant Orders   US PELVIS TRANSVANGINAL NON-OB (TV ONLY)      1) We discussed that menopause is a clinical diagnosis made after 12 months of amenorrhea.  The average age of menopause in the  General [place] population is 63 but there may be significant variation.  Any bleeding that happens after a 12 month period of amenorrhea warrants further work.  Possible etiologies of postmenopausal bleeding were discussed with the patient today.  These may range from benign etiologies such as urethral prolapse and atrophy, to indeterminate lesions such as submucosal fibroids or polyps which would require resection to accurately evaluate. The role of unopposed estrogen in the development of  dndometrial hyperplasia or carcinoma is discussed.  The risk of endometrial hyperplasia is linearly correlated with increasing BMI given the production of estrone by adipose  tissue.  Work up will be include transvaginal ultrasound to assess the thickness of the endometrial lining as well as to assess for focal uterine lesions.  Negative ultrasound evaluation, defined as the absence of focal lesions and endometrial stripe of <35mm, effectively rules out carcinoma and confirms atrophy as the most likely etiology.  Should focal lesions be present these generally require hysteroscopic resection.  Should lining be greater >77mm endometrial biopsy is warranted to rule out hyperplasia or frank endometrial cancer.  Continued episodes of bleeding despite negative ultrasound also warrant endometrial sampling.  As the cervical pathology may also be implicated in postmenopausal bleeding prior cervical cytology was reviewed and repeated if required per ASCCP guidelines.   2) Evaluation by pelvc ultrasound scheduled, with follow up after [place]. EMB discussed and may be performed as  well. Pros and cons of these modalities of testing discussed.   3) Return in about 1 week (around 01/21/2018) for GYN [place] and follow up.   Malachy Mood, MD, [facility], [facility] 01/14/2018, 6:05 PM

## 2018-01-16 ENCOUNTER — Encounter: Payer: Self-pay | Admitting: Obstetrics and Gynecology

## 2018-01-21 ENCOUNTER — Ambulatory Visit (INDEPENDENT_AMBULATORY_CARE_PROVIDER_SITE_OTHER): Payer: BC Managed Care – PPO | Admitting: Obstetrics and Gynecology

## 2018-01-21 ENCOUNTER — Ambulatory Visit (INDEPENDENT_AMBULATORY_CARE_PROVIDER_SITE_OTHER): Payer: BC Managed Care – PPO

## 2018-01-21 ENCOUNTER — Encounter: Payer: Self-pay | Admitting: Obstetrics and Gynecology

## 2018-01-21 DIAGNOSIS — N950 Postmenopausal bleeding: Secondary | ICD-10-CM

## 2018-01-21 DIAGNOSIS — N841 Polyp of cervix uteri: Secondary | ICD-10-CM | POA: Diagnosis not present

## 2018-01-21 NOTE — Progress Notes (Signed)
Gynecology Ultrasound Follow Up  Chief Complaint:  Chief Complaint  Patient presents with  . U/S follow up    GYN U/S     History of Present Illness: Patient is a 62 y.o. female who presents today for ultrasound evaluation of postmenopausal bleeding.  Ultrasound demonstrates the following findgins Adnexa: no masses seen Uterus: Non-enlarged with endometrial stripe 0.64mm Additional: no free fluid.  The cervix contains fluid and what appears to be an endocervical polyp.  The patient reports a remote history of prior LEEP procedure.  Most recent cervical pathology normal.    Review of Systems: ROS  Past Medical History:  Past Medical History:  Diagnosis Date  . Anemia   . Eczema   . History of hiatal hernia   . Migraines    food and stress induced    Past Surgical History:  Past Surgical History:  Procedure Laterality Date  . COLONOSCOPY WITH PROPOFOL N/A 08/24/2016   Procedure: COLONOSCOPY WITH PROPOFOL;  Surgeon: Lucilla Lame, MD;  Location: [place];  Service: Endoscopy;  Laterality: N/A;  Latex sensitivity  . DILATION AND CURETTAGE OF UTERUS    . POLYPECTOMY  08/24/2016   Procedure: POLYPECTOMY;  Surgeon: Lucilla Lame, MD;  Location: [place];  Service: Endoscopy;;    Gynecologic History:  No LMP recorded. Patient is postmenopausal.   Family History:  Family History  Problem Relation Age of Onset  . Breast cancer Maternal Grandmother     Social History:  Social History   Socioeconomic History  . Marital status: Married    Spouse name: Not on file  . Number of children: Not on file  . Years of education: Not on file  . Highest education level: Not on file  Occupational History  . Not on file  Social Needs  . Financial resource strain: Not on file  . Food insecurity:    Worry: Not on file    Inability: Not on file  . Transportation needs:    Medical: Not on file    Non-medical: Not on file  Tobacco Use  . Smoking status: Never  Smoker  . Smokeless tobacco: Never Used  Substance and Sexual Activity  . Alcohol use: No  . Drug use: No  . Sexual activity: Yes    Birth control/protection: None  Lifestyle  . Physical activity:    Days per week: Not on file    Minutes per session: Not on file  . Stress: Not on file  Relationships  . Social connections:    Talks on phone: Not on file    Gets together: Not on file    Attends religious service: Not on file    Active member of club or organization: Not on file    Attends meetings of clubs or organizations: Not on file    Relationship status: Not on file  . Intimate partner violence:    Fear of current or ex partner: Not on file    Emotionally abused: Not on file    Physically abused: Not on file    Forced sexual activity: Not on file  Other Topics Concern  . Not on file  Social History Narrative  . Not on file    Allergies:  Allergies  Allergen Reactions  . Penicillins Hives  . Sulfa Antibiotics Hives  . Latex Hives    Diaphragm, gloves  . Milk-Related Compounds Nausea Only    Dairy - stomach ache    Medications: Prior to Admission medications  Medication Sig Start Date End Date Taking? Authorizing Provider  cetirizine (ZYRTEC) 10 MG tablet Take 10 mg by mouth daily.    [provider]  conjugated estrogens (PREMARIN) vaginal cream Place 1 Applicatorful vaginally 2 (two) times a week. 08/02/17   Malachy Mood, MD  ferrous sulfate 325 (65 FE) MG tablet TAKE 1 (ONE) TABLET BY MOUTH TWO TIMES DAILY 08/04/16   [provider]  levothyroxine (SYNTHROID, LEVOTHROID) 100 MCG tablet Take 100 mcg by mouth daily. 11/01/17   [provider]    Physical Exam Vitals: Blood pressure 112/84, pulse 75, weight 156 lb (70.8 kg).  General: NAD HEENT: normocephalic, anicteric Pulmonary: No increased work of breathing Extremities: no edema, erythema, or tenderness Neurologic: Grossly intact, normal gait Psychiatric: mood  appropriate, affect full  US Pelvis Transvanginal Non-ob (tv Only)  Result Date: 01/22/2018 ULTRASOUND REPORT Location: [facility] Date of Service: 01/21/2018 Indications:Postmenopausal bleeding Findings: The uterus is anteverted and measures 4.34 x 3.02 x 1.96cm. Echo texture is homogenous without evidence of focal masses. There is an area within the cervical canal suspicious for blood clot vs other measuring 0.79 x 0.57cm. The Endometrium measures 0.87 mm. Right Ovary  is not identified. Left Ovary is not identified. Survey of the adnexa demonstrates no adnexal masses. There is no free fluid in the cul de sac. The urinary bladder has a post void residual of 63.214 cc's Impression: 1. Blood clot vs other within the cervix 2. PVR of urinary bladder Recommendations: 1.Clinical correlation with the patient's History and Physical Exam. Edwena Bunde, RDMS, RVT Images reviewed, no adnexal pathology.  Atrophic endometrium.  Cervix with cervical polyp. Malachy Mood, MD, [facility], [facility] 01/22/2018, 11:38 AM      Assessment: 62 y.o. G1P0010 No problem-specific Assessment & Plan notes found for this encounter.   Plan: Problem List Items Addressed This Visit      Genitourinary   Cervical polyp     Other   Postmenopausal bleeding      1) Cervical polyp - I have discussed with the patient the indications for the procedure. Included in the discussion were the options of therapy, as wall as their individual risks, benefits, and complications. Ample time was given to answer all questions.   In office pipelle biopsy generally provides comparable results to [facility], however this sampling modality may miss focal lesions if these were previously documented on ultrasound.  It is because of the potential to miss focal lesions that hysteroscopy D&C is also warranted in patient with continued postmenopausal bleeding that is not self limited regardless of prior in office biopsy  results or ultrasound findings.  She understands that the risk of continued observation include worsening bleeding or worsening of any underlying pathology.  The choices include: 1. Doing nothing but following her symptoms 2. Attempts at hormonal manipulation with either BCP or Depo-Provera for premenopausal patients with no concern for focal lesion or endometrial pathology 3. D&C/hysteroscopy. 4. Endometrial ablation via Novasure or other techniques for premenopausal patients with no concern for focal lesion or endometrial pathology  5. As final resort, hysterectomy. After consideration of her history and findings, mutual decision has been made to proceed with D+C/hysteroscopy. While the incidence is low, the risks from this surgery include, but are not limited to, the risks of anesthesia, hemorrhage, infection, perforation, and injury to adjacent structures including bowel, bladder and blood vessels.     Malachy Mood, MD, [facility], [facility] 01/22/2018, 11:42 AM

## 2018-01-22 DIAGNOSIS — N950 Postmenopausal bleeding: Secondary | ICD-10-CM | POA: Insufficient documentation

## 2018-01-22 DIAGNOSIS — N841 Polyp of cervix uteri: Secondary | ICD-10-CM | POA: Insufficient documentation

## 2018-01-23 ENCOUNTER — Telehealth: Payer: Self-pay | Admitting: Obstetrics and Gynecology

## 2018-01-23 NOTE — Telephone Encounter (Signed)
Patient is aware of OR on 02/21/18. Patient is aware Dr. Georgianne Fick will do consents on the day of surgery, and that she should expect calls from [place], and the [facility].  Insurance deductible and coinsurance discussed. Ext given.

## 2018-01-23 NOTE — Telephone Encounter (Signed)
-----   Message from Malachy Mood, MD sent at 01/22/2018 11:36 AM EDT ----- Regarding: Surgery Surgery Date:   LOS: same day surgery  Surgery Booking Request Patient Full Name: Barbara Walter MRN: [id number]  DOB: April 16, 1956  Surgeon: Malachy Mood, MD  Requested Surgery Date and Time: 1h Primary Diagnosis and Code: cervical polyp Secondary Diagnosis and Code: postmenopausal bleeding Surgical Procedure: Hysteroscopy, D&C L&D Notification:N/A Admission Status: same day surgery Length of Surgery: 1h Special Case Needs: none H&P: day of is fine (date) Phone Interview or Office Pre-Admit: phone interview Interpreter: No Language: English Medical Clearance: No Special Scheduling Instructions:

## 2018-01-24 ENCOUNTER — Encounter: Payer: Self-pay | Admitting: Obstetrics and Gynecology

## 2018-01-27 ENCOUNTER — Encounter: Payer: Self-pay | Admitting: Obstetrics and Gynecology

## 2018-02-19 ENCOUNTER — Encounter: Payer: Self-pay | Admitting: Obstetrics and Gynecology

## 2018-02-21 ENCOUNTER — Other Ambulatory Visit: Payer: Self-pay

## 2018-02-21 ENCOUNTER — Encounter
Admission: RE | Admit: 2018-02-21 | Discharge: 2018-02-21 | Disposition: A | Discharge status: Home / Self Care | Payer: BC Managed Care – PPO | Source: Ambulatory Visit | Attending: Obstetrics and Gynecology | Admitting: Obstetrics and Gynecology

## 2018-02-21 HISTORY — DX: Gastro-esophageal reflux disease without esophagitis: K21.9

## 2018-02-21 HISTORY — DX: Unspecified hemorrhoids: K64.9

## 2018-02-21 HISTORY — DX: Hypothyroidism, unspecified: E03.9

## 2018-02-21 NOTE — Patient Instructions (Signed)
Your procedure is scheduled on: 02-28-18 Report to Same Day Surgery 2nd floor medical mall [facility] Entrance-take elevator on left to 2nd floor.  Check in with surgery information desk.) To find out your arrival time please call [phone] between 1PM - 3PM on 02-27-18  Remember: Instructions that are not followed completely may result in serious medical risk, up to and including death, or upon the discretion of your surgeon and anesthesiologist your surgery may need to be rescheduled.    _x___ 1. Do not eat food after midnight the night before your procedure. NO GUM OR CANDY AFTER MIDNIGHT.  You may drink clear liquids up to 2 hours before you are scheduled to arrive at the hospital for your procedure.  Do not drink clear liquids within 2 hours of your scheduled arrival to the hospital.  Clear liquids include  --Water or Apple juice without pulp  --Clear carbohydrate beverage such as ClearFast or Gatorade  --Black Coffee or Clear Tea (No milk, no creamers, do not add anything to the coffee or Tea    __x__ 2. No Alcohol for 24 hours before or after surgery.   __x__3. No Smoking or e-cigarettes for 24 prior to surgery.  Do not use any chewable tobacco products for at least 6 hour prior to surgery   ____  4. Bring all medications with you on the day of surgery if instructed.    __x__ 5. Notify your doctor if there is any change in your medical condition     (cold, fever, infections).    x___6. On the morning of surgery brush your teeth with toothpaste and water.  You may rinse your mouth with mouth wash if you wish.  Do not swallow any toothpaste or mouthwash.   Do not wear jewelry, make-up, hairpins, clips or nail polish.  Do not wear lotions, powders, or perfumes. You may wear deodorant.  Do not shave 48 hours prior to surgery. Men may shave face and neck.  Do not bring valuables to the hospital.    [facility] is not responsible for any belongings or valuables.    Contacts, dentures or bridgework may not be worn into surgery.  Leave your suitcase in the car. After surgery it may be brought to your room.  For patients admitted to the hospital, discharge time is determined by your  treatment team.  _  Patients discharged the day of surgery will not be allowed to drive home.  You will need someone to drive you home and stay with you the night of your procedure.   _x___ TAKE THE FOLLOWING MEDICATION THE MORNING OF SURGERY WITH A SMALL SIP OF WATER. These include:  1. LEVOTHYROXINE  2.  3.  4.  5.  6.  ____Fleets enema or Magnesium Citrate as directed.   ____ Use CHG Soap or sage wipes as directed on instruction sheet   ____ Use inhalers on the day of surgery and bring to hospital day of surgery  ____ Stop Metformin and Janumet 2 days prior to surgery.    ____ Take 1/2 of usual insulin dose the night before surgery and none on the morning surgery.   ____ Follow recommendations from Cardiologist, Pulmonologist or PCP regarding  stopping Aspirin, Coumadin, Plavix ,Eliquis, Effient, or Pradaxa, and Pletal.  X____Stop Anti-inflammatories such as Advil, Aleve, Ibuprofen, Motrin, Naproxen, Naprosyn, Goodies powders or aspirin products NOW- OK to take Tylenol    ____ Stop supplements until after surgery.    ____ Bring C-Pap to  the hospital.

## 2018-02-28 ENCOUNTER — Ambulatory Visit
Admission: RE | Admit: 2018-02-28 | Discharge: 2018-02-28 | Disposition: A | Discharge status: Home / Self Care | Payer: BC Managed Care – PPO | Source: Ambulatory Visit | Attending: Obstetrics and Gynecology | Admitting: Obstetrics and Gynecology

## 2018-02-28 ENCOUNTER — Ambulatory Visit: Payer: BC Managed Care – PPO | Admitting: Certified Registered"

## 2018-02-28 ENCOUNTER — Other Ambulatory Visit: Payer: Self-pay

## 2018-02-28 ENCOUNTER — Encounter
Admission: RE | Disposition: A | Discharge status: Home / Self Care | Payer: Self-pay | Source: Ambulatory Visit | Attending: Obstetrics and Gynecology

## 2018-02-28 ENCOUNTER — Encounter: Payer: Self-pay | Admitting: *Deleted

## 2018-02-28 DIAGNOSIS — N888 Other specified noninflammatory disorders of cervix uteri: Secondary | ICD-10-CM | POA: Diagnosis not present

## 2018-02-28 DIAGNOSIS — D649 Anemia, unspecified: Secondary | ICD-10-CM | POA: Diagnosis not present

## 2018-02-28 DIAGNOSIS — N841 Polyp of cervix uteri: Secondary | ICD-10-CM | POA: Insufficient documentation

## 2018-02-28 DIAGNOSIS — Z79890 Hormone replacement therapy (postmenopausal): Secondary | ICD-10-CM | POA: Insufficient documentation

## 2018-02-28 DIAGNOSIS — E039 Hypothyroidism, unspecified: Secondary | ICD-10-CM | POA: Diagnosis not present

## 2018-02-28 DIAGNOSIS — Z87891 Personal history of nicotine dependence: Secondary | ICD-10-CM | POA: Insufficient documentation

## 2018-02-28 DIAGNOSIS — N882 Stricture and stenosis of cervix uteri: Secondary | ICD-10-CM | POA: Diagnosis not present

## 2018-02-28 DIAGNOSIS — K219 Gastro-esophageal reflux disease without esophagitis: Secondary | ICD-10-CM | POA: Diagnosis not present

## 2018-02-28 DIAGNOSIS — Z9889 Other specified postprocedural states: Secondary | ICD-10-CM

## 2018-02-28 DIAGNOSIS — Z79899 Other long term (current) drug therapy: Secondary | ICD-10-CM | POA: Insufficient documentation

## 2018-02-28 HISTORY — PX: HYSTEROSCOPY WITH D & C: SHX1775

## 2018-02-28 LAB — HEMOGLOBIN: HEMOGLOBIN: 12.7 g/dL (ref 12.0–16.0)

## 2018-02-28 SURGERY — DILATATION AND CURETTAGE /HYSTEROSCOPY
Anesthesia: General

## 2018-02-28 MED ORDER — DEXAMETHASONE SODIUM PHOSPHATE 10 MG/ML IJ SOLN
INTRAMUSCULAR | Status: DC | PRN
  Administered 2018-02-28: 8 mg via INTRAVENOUS

## 2018-02-28 MED ORDER — FENTANYL CITRATE (PF) 100 MCG/2ML IJ SOLN
25.0000 ug | INTRAMUSCULAR | Status: AC | PRN
  Administered 2018-02-28 (×6): 25 ug via INTRAVENOUS

## 2018-02-28 MED ORDER — MIDAZOLAM HCL 2 MG/2ML IJ SOLN
INTRAMUSCULAR | Status: AC
  Filled 2018-02-28: qty 2

## 2018-02-28 MED ORDER — ONDANSETRON HCL 4 MG/2ML IJ SOLN
INTRAMUSCULAR | Status: AC
  Filled 2018-02-28: qty 2

## 2018-02-28 MED ORDER — FENTANYL CITRATE (PF) 100 MCG/2ML IJ SOLN
INTRAMUSCULAR | Status: DC | PRN
  Administered 2018-02-28 (×2): 50 ug via INTRAVENOUS

## 2018-02-28 MED ORDER — PROMETHAZINE HCL 25 MG/ML IJ SOLN
12.5000 mg | Freq: Once | INTRAMUSCULAR | Status: AC
  Administered 2018-02-28: 12.5 mg via INTRAVENOUS

## 2018-02-28 MED ORDER — LIDOCAINE HCL (PF) 2 % IJ SOLN
INTRAMUSCULAR | Status: AC
  Filled 2018-02-28: qty 10

## 2018-02-28 MED ORDER — FENTANYL CITRATE (PF) 100 MCG/2ML IJ SOLN
INTRAMUSCULAR | Status: AC
  Administered 2018-02-28: 25 ug via INTRAVENOUS
  Filled 2018-02-28: qty 2

## 2018-02-28 MED ORDER — FAMOTIDINE 20 MG PO TABS
ORAL_TABLET | ORAL | Status: AC
  Filled 2018-02-28: qty 1

## 2018-02-28 MED ORDER — ONDANSETRON HCL 4 MG/2ML IJ SOLN: 4.0000 mg | Freq: Once | INTRAMUSCULAR | Status: DC | PRN

## 2018-02-28 MED ORDER — ONDANSETRON HCL 4 MG/2ML IJ SOLN
INTRAMUSCULAR | Status: DC | PRN
  Administered 2018-02-28: 4 mg via INTRAVENOUS

## 2018-02-28 MED ORDER — FAMOTIDINE 20 MG PO TABS
20.0000 mg | ORAL_TABLET | Freq: Once | ORAL | Status: AC
  Administered 2018-02-28: 20 mg via ORAL

## 2018-02-28 MED ORDER — DEXAMETHASONE SODIUM PHOSPHATE 10 MG/ML IJ SOLN
INTRAMUSCULAR | Status: AC
  Filled 2018-02-28: qty 1

## 2018-02-28 MED ORDER — FENTANYL CITRATE (PF) 100 MCG/2ML IJ SOLN
INTRAMUSCULAR | Status: AC
  Filled 2018-02-28: qty 2

## 2018-02-28 MED ORDER — MIDAZOLAM HCL 2 MG/2ML IJ SOLN
INTRAMUSCULAR | Status: DC | PRN
  Administered 2018-02-28: 2 mg via INTRAVENOUS

## 2018-02-28 MED ORDER — LACTATED RINGERS IV SOLN
INTRAVENOUS | Status: DC
  Administered 2018-02-28: 09:00:00 via INTRAVENOUS

## 2018-02-28 MED ORDER — PROMETHAZINE HCL 25 MG/ML IJ SOLN
INTRAMUSCULAR | Status: AC
  Administered 2018-02-28: 12.5 mg via INTRAVENOUS
  Filled 2018-02-28: qty 1

## 2018-02-28 MED ORDER — PROPOFOL 10 MG/ML IV BOLUS
INTRAVENOUS | Status: DC | PRN
  Administered 2018-02-28: 150 mg via INTRAVENOUS

## 2018-02-28 MED ORDER — LIDOCAINE HCL (CARDIAC) PF 100 MG/5ML IV SOSY
PREFILLED_SYRINGE | INTRAVENOUS | Status: DC | PRN
  Administered 2018-02-28: 80 mg via INTRAVENOUS

## 2018-02-28 MED ORDER — PROPOFOL 10 MG/ML IV BOLUS
INTRAVENOUS | Status: AC
  Filled 2018-02-28: qty 20

## 2018-02-28 MED ORDER — GLYCOPYRROLATE 0.2 MG/ML IJ SOLN
INTRAMUSCULAR | Status: DC | PRN
  Administered 2018-02-28: 0.2 mg via INTRAVENOUS

## 2018-02-28 MED ORDER — SODIUM CHLORIDE FLUSH 0.9 % IV SOLN
INTRAVENOUS | Status: AC
  Filled 2018-02-28: qty 10

## 2018-02-28 MED ORDER — IBUPROFEN 600 MG PO TABS: 600.0000 mg | ORAL_TABLET | Freq: Four times a day (QID) | ORAL | 3 refills | Status: DC | PRN

## 2018-02-28 MED ORDER — GLYCOPYRROLATE 0.2 MG/ML IJ SOLN
INTRAMUSCULAR | Status: AC
  Filled 2018-02-28: qty 1

## 2018-02-28 SURGICAL SUPPLY — 18 items
CATH FOLEY 2WAY  5CC 16FR (CATHETERS) ×2
CATH ROBINSON RED A/P 16FR (CATHETERS) IMPLANT
CATH URTH 16FR FL 2W BLN LF (CATHETERS) ×1 IMPLANT
DEVICE MYOSURE LITE (MISCELLANEOUS) ×3 IMPLANT
ELECT REM PT RETURN 9FT ADLT (ELECTROSURGICAL)
ELECTRODE REM PT RTRN 9FT ADLT (ELECTROSURGICAL) IMPLANT
GLOVE BIO SURGEON STRL SZ7 (GLOVE) ×3 IMPLANT
GLOVE INDICATOR 7.5 STRL GRN (GLOVE) ×3 IMPLANT
GOWN STRL REUS W/ TWL LRG LVL3 (GOWN DISPOSABLE) ×2 IMPLANT
GOWN STRL REUS W/TWL LRG LVL3 (GOWN DISPOSABLE) ×4
IV LACTATED RINGERS 1000ML (IV SOLUTION) ×3 IMPLANT
KIT TURNOVER CYSTO (KITS) ×3 IMPLANT
PACK DNC HYST (MISCELLANEOUS) ×3 IMPLANT
PAD OB MATERNITY 4.3X12.25 (PERSONAL CARE ITEMS) ×3 IMPLANT
PAD PREP 24X41 OB/GYN DISP (PERSONAL CARE ITEMS) ×3 IMPLANT
TOWEL OR 17X26 4PK STRL BLUE (TOWEL DISPOSABLE) ×3 IMPLANT
TUBING CONNECTING 10 (TUBING) ×2 IMPLANT
TUBING CONNECTING 10' (TUBING) ×1

## 2018-02-28 NOTE — Anesthesia Post-op Follow-up Note (Signed)
Anesthesia QCDR form completed.        

## 2018-02-28 NOTE — Op Note (Signed)
Preoperative Diagnosis: 1) 62 y.o. with postmenopausal bleeding 2) Cervical polyp on ultrasound  Postoperative Diagnosis: 1) 62 y.o. with postmenopausal bleeding 2) Cervical stenosis 3) Cervical Mucocele  4) Uterine synechia   Operation Performed: Hysteroscopy, dilation and curettage  Indication: Postmenopausal bleeding with suggestion of endocervical polyp on ultrasound  Anesthesia: General  Primary Surgeon: Malachy Mood, MD  Assistant: none  Preoperative Antibiotics: none  Estimated Blood Loss: 5 mL  IV Fluids:341mL  Urine Output:: ~136mL straight cath  Drains or Tubes: none  Implants: none  Specimens Removed: endometrial and endocervical curettings  Complications: none  Intraoperative Findings: Cervical stenosis.  On dilating the cervix a significant amount of mucous was encountered. Small atrophic cavity with uterine synechia at fundus.  Atrophic appearing endometrium.  Patient Condition: stable  Procedure in Detail:  Patient was taken to the operating room were she was administered general endotracheal anesthesia.  She was positioned in the dorsal lithotomy position utilizing Allen stirups, prepped and draped in the usual sterile fashion.  Uterus was noted to be non-enalrged in size, anteverted.   Prior to proceeding with the case a time out was performed.  Attention was turned to the patient's pelvis.  A non-latex foleyr catheter was used to empty thepatient's bladder.  An operative speculum was placed to allow visualization of the cervix.  The anterior lip of the cervix was grasped with a single tooth tenaculum and the cervix was noted to be severely stenotic.  The cervix was sequentially dilated using lacrimal duct dilators followed by pratt dilators.  Findings on dilation were consistent with a cervical mucocele.  The hysteroscope was then advanced into the uterine cavity noting the above findings.  Curettage was performed using a myosure and the resulting  specimen collected and sent to pathology.    The single tooth tenaculum was removed from the cervix.  The tenaculum sites and cervix were noted to be  Hemostatic before removing the operative speculum.  Sponge needle and instrument counts were corrects times two.  The patient tolerated the procedure well and was taken to the recovery room in stable condition.

## 2018-02-28 NOTE — Transfer of Care (Signed)
Immediate Anesthesia Transfer of Care Note  Patient: Barbara Walter  Procedure(s) Performed: DILATATION AND CURETTAGE Carl Best (N/A )  Patient Location: PACU  Anesthesia Type:General  Level of Consciousness: sedated  Airway & Oxygen Therapy: Patient Spontanous Breathing and Patient connected to face mask oxygen  Post-op Assessment: Report given to RN and Post -op Vital signs reviewed and stable  Post vital signs: Reviewed and stable  Last Vitals:  Vitals Value Taken Time  BP 127/83 02/28/2018 11:08 AM  Temp    Pulse 66 02/28/2018 11:08 AM  Resp 13 02/28/2018 11:08 AM  SpO2 100 % 02/28/2018 11:08 AM    Last Pain:  Vitals:   02/28/18 0818  TempSrc: Tympanic  PainSc: 5          Complications: No apparent anesthesia complications

## 2018-02-28 NOTE — Discharge Instructions (Signed)

## 2018-02-28 NOTE — Anesthesia Preprocedure Evaluation (Signed)
Anesthesia Evaluation  Patient identified by MRN, date of birth, ID band Patient awake    Reviewed: Allergy & Precautions, H&P , NPO status , Patient's Chart, lab work & pertinent test results, reviewed documented beta blocker date and time   Airway Mallampati: II  TM Distance: >3 FB Neck ROM: full    Dental  (+) Teeth Intact   Pulmonary neg pulmonary ROS, former smoker,    Pulmonary exam normal        Cardiovascular Exercise Tolerance: Good negative cardio ROS Normal cardiovascular exam Rate:Normal     Neuro/Psych  Headaches, negative neurological ROS  negative psych ROS   GI/Hepatic negative GI ROS, Neg liver ROS, hiatal hernia, GERD  Medicated,  Endo/Other  negative endocrine ROSHypothyroidism   Renal/GU negative Renal ROS  negative genitourinary   Musculoskeletal   Abdominal   Peds  Hematology negative hematology ROS (+) anemia ,   Anesthesia Other Findings   Reproductive/Obstetrics negative OB ROS                             Anesthesia Physical Anesthesia Plan  ASA: II  Anesthesia Plan: General LMA   Post-op Pain Management:    Induction:   PONV Risk Score and Plan: 4 or greater  Airway Management Planned:   Additional Equipment:   Intra-op Plan:   Post-operative Plan:   Informed Consent: I have reviewed the patients History and Physical, chart, labs and discussed the procedure including the risks, benefits and alternatives for the proposed anesthesia with the patient or authorized representative who has indicated his/her understanding and acceptance.     Plan Discussed with: CRNA  Anesthesia Plan Comments:         Anesthesia Quick Evaluation

## 2018-02-28 NOTE — Anesthesia Procedure Notes (Signed)
Procedure Name: LMA Insertion Performed by: Jacarri Gesner, CRNA Pre-anesthesia Checklist: Patient identified, Patient being monitored, Timeout performed, Emergency Drugs available and Suction available Patient Re-evaluated:Patient Re-evaluated prior to induction Oxygen Delivery Method: Circle system utilized Preoxygenation: Pre-oxygenation with 100% oxygen Induction Type: IV induction Ventilation: Mask ventilation without difficulty LMA: LMA inserted LMA Size: 3.5 Tube type: Oral Number of attempts: 1 Placement Confirmation: positive ETCO2 and breath sounds checked- equal and bilateral Tube secured with: Tape Dental Injury: Teeth and Oropharynx as per pre-operative assessment        

## 2018-02-28 NOTE — H&P (Signed)
Obstetrics & Gynecology Surgery H&P    Chief Complaint: Scheduled Surgery   History of Present Illness: Patient is a 62 y.o. G1P0010 presenting for scheduled hysteroscopy D&C, for the treatment or further evaluation of postmenopausal bleeding.   Prior Treatments prior to proceeding with surgery include: ultrasound evaluation with concern for focal lesion  Ultrasound demonstrates the following findgins Adnexa: no masses seen Uterus: Non-enlarged with endometrial stripe 0.6mm Additional: no free fluid.  The cervix contains fluid and what appears to be an endocervical polyp.  The patient reports a remote history of prior LEEP procedure.  Most recent cervical pathology normal.    Review of Systems:10 point review of systems  Past Medical History:  Past Medical History:  Diagnosis Date  . Anemia   . Eczema   . GERD (gastroesophageal reflux disease)    no meds  . Hemorrhoids   . History of hiatal hernia   . Hypothyroidism   . Migraines    food and stress induced    Past Surgical History:  Past Surgical History:  Procedure Laterality Date  . COLONOSCOPY WITH PROPOFOL N/A 08/24/2016   Procedure: COLONOSCOPY WITH PROPOFOL;  Surgeon: Lucilla Lame, MD;  Location: [place];  Service: Endoscopy;  Laterality: N/A;  Latex sensitivity  . DILATION AND CURETTAGE OF UTERUS    . POLYPECTOMY  08/24/2016   Procedure: POLYPECTOMY;  Surgeon: Lucilla Lame, MD;  Location: [place];  Service: Endoscopy;;    Family History:  Family History  Problem Relation Age of Onset  . Breast cancer Maternal Grandmother     Social History:  Social History   Socioeconomic History  . Marital status: Married    Spouse name: Not on file  . Number of children: Not on file  . Years of education: Not on file  . Highest education level: Not on file  Occupational History  . Not on file  Social Needs  . Financial resource strain: Not on file  . Food insecurity:    Worry: Not on file      Inability: Not on file  . Transportation needs:    Medical: Not on file    Non-medical: Not on file  Tobacco Use  . Smoking status: Former Smoker    Packs/day: 0.50    Types: Cigarettes  . Smokeless tobacco: Never Used  Substance and Sexual Activity  . Alcohol use: No  . Drug use: No  . Sexual activity: Yes    Birth control/protection: None  Lifestyle  . Physical activity:    Days per week: Not on file    Minutes per session: Not on file  . Stress: Not on file  Relationships  . Social connections:    Talks on phone: Not on file    Gets together: Not on file    Attends religious service: Not on file    Active member of club or organization: Not on file    Attends meetings of clubs or organizations: Not on file    Relationship status: Not on file  . Intimate partner violence:    Fear of current or ex partner: Not on file    Emotionally abused: Not on file    Physically abused: Not on file    Forced sexual activity: Not on file  Other Topics Concern  . Not on file  Social History Narrative  . Not on file    Allergies:  Allergies  Allergen Reactions  . Milk-Related Compounds     Dairy causes eczema  flares and affects breathing (not shortness of breath)   . Penicillins Hives    Has patient had a PCN reaction causing immediate rash, facial/tongue/throat swelling, SOB or lightheadedness with hypotension: No Has patient had a PCN reaction causing severe rash involving mucus membranes or skin necrosis: Yes Has patient had a PCN reaction that required hospitalization: No Has patient had a PCN reaction occurring within the last 10 years: No If all of the above answers are "NO", then may proceed with Cephalosporin use.   . Sulfa Antibiotics Hives  . Latex Hives    Diaphragm, gloves  . Rosin [Pinus Strobus] Hives and Nausea Only    Eczema flare     Medications: Prior to Admission medications   Medication Sig Start Date End Date Taking? Authorizing Provider  ARNICA  EX Apply 1 application topically daily as needed (bruises).   Yes [provider]  cetirizine (ZYRTEC) 10 MG tablet Take 10 mg by mouth daily.   Yes [provider]  conjugated estrogens (PREMARIN) vaginal cream Place 1 Applicatorful vaginally 2 (two) times a week. Patient taking differently: Place 1 Applicatorful vaginally daily as needed (irritation).  08/02/17  Yes Malachy Mood, MD  ferrous sulfate 325 (65 FE) MG tablet Take 325 mg by mouth twice daily 08/04/16  Yes [provider]  hydrocortisone cream 1 % Apply 1 application topically as needed for itching.   Yes [provider]  levothyroxine (SYNTHROID, LEVOTHROID) 100 MCG tablet Take 100 mcg by mouth daily before breakfast.  11/01/17  Yes [provider]  Multiple Vitamin (MULTIVITAMIN WITH MINERALS) TABS tablet Take 1 tablet by mouth daily.   Yes [provider]    Physical Exam Vitals: Blood pressure 116/78, pulse 68, temperature 97.8 F (36.6 C), temperature source Tympanic, resp. rate 12, height 5\' 3"  (1.6 m), weight 156 lb (70.8 kg), SpO2 100 %. General: NAD HEENT: normocephalic, anicteric Pulmonary: No increased work of breathing Cardiovascular: RRR, distal pulses 2+ Abdomen: soft, non-tender Genitourinary: deferred to OR Extremities: no edema, erythema, or tenderness Neurologic: Grossly intact Psychiatric: mood appropriate, affect full    The visualized pathology is anterior cervix approximately 2cm from cervical os, ddx includes cervical polyp, nabothian cyst, or inclusion cyst/scarring from prior LEEP  Assessment: 62 y.o. G1P0010 presenting for scheduled hysteroscopy D&C postmenopausal bleeding and lower uterine segment vs endometrial polyp  Plan: 1) I have discussed with the patient the indications for the procedure. Included in the discussion were the options of therapy, as wall as their individual risks, benefits, and complications. Ample time was given to  answer all questions.   In office pipelle biopsy generally provides comparable results to [facility], however this sampling modality may miss focal lesions if these were previously documented on ultrasound.  It is because of the potential to miss focal lesions that hysteroscopy D&C is also warranted in patient with continued postmenopausal bleeding that is not self limited regardless of prior in office biopsy results or ultrasound findings.  She understands that the risk of continued observation include worsening bleeding or worsening of any underlying pathology.  The choices include: 1. Doing nothing but following her symptoms 2. Attempts at hormonal manipulation with either BCP or Depo-Provera for premenopausal patients with no concern for focal lesion or endometrial pathology 3. D&C/hysteroscopy. 4. Endometrial ablation via Novasure or other techniques for premenopausal patients with no concern for focal lesion or endometrial pathology  5. As final resort, hysterectomy. After consideration of her history and findings, mutual decision has been made  to proceed with D+C/hysteroscopy. While the incidence is low, the risks from this surgery include, but are not limited to, the risks of anesthesia, hemorrhage, infection, perforation, and injury to adjacent structures including bowel, bladder and blood vessels.    2) Routine postoperative instructions were reviewed with the patient and her family in detail today including the expected length of recovery and likely postoperative course.  The patient concurred with the proposed plan, giving informed written consent for the surgery today.  Patient instructed on the importance of being NPO after midnight prior to her procedure.  If warranted preoperative prophylactic antibiotics and SCDs ordered on call to the OR to meet SCIP guidelines and adhere to recommendation laid forth in [place] Number 104 May 2009  "Antibiotic Prophylaxis for Gynecologic Procedures".      Malachy Mood, MD, [facility], [facility] 02/28/2018, 9:40 AM

## 2018-03-03 NOTE — Anesthesia Postprocedure Evaluation (Signed)
Anesthesia Post Note  Patient: Barbara Walter  Procedure(s) Performed: DILATATION AND CURETTAGE Carl Best (N/A )  Anesthesia Type: General     Last Vitals:  Vitals:   02/28/18 1158 02/28/18 1210  BP: 134/70 127/67  Pulse: 62 63  Resp: 14 15  Temp: (!) 35.7 C   SpO2: 94% 99%    Last Pain:  Vitals:   03/01/18 0812  TempSrc:   PainSc: 0-No pain                 Molli Barrows

## 2018-03-05 ENCOUNTER — Encounter: Payer: Self-pay | Admitting: Obstetrics and Gynecology

## 2018-03-08 LAB — SURGICAL PATHOLOGY

## 2018-03-18 ENCOUNTER — Encounter: Payer: Self-pay | Admitting: Obstetrics and Gynecology

## 2018-03-18 ENCOUNTER — Ambulatory Visit (INDEPENDENT_AMBULATORY_CARE_PROVIDER_SITE_OTHER): Payer: BC Managed Care – PPO | Admitting: Obstetrics and Gynecology

## 2018-03-18 VITALS — BP 120/80 | Ht 63.5 in | Wt 154.0 lb

## 2018-03-18 DIAGNOSIS — Z124 Encounter for screening for malignant neoplasm of cervix: Secondary | ICD-10-CM

## 2018-03-18 DIAGNOSIS — Z01818 Encounter for other preprocedural examination: Secondary | ICD-10-CM

## 2018-03-19 NOTE — Progress Notes (Signed)
      Postoperative Follow-up Patient presents post op from hysteroscopy D&C 1weeks ago for PMB and cervical polyp.  Subjective: Patient reports marked improvement in her preop symptoms. Eating a regular diet without difficulty. The patient is not having any pain.  Activity: normal activities of daily living.  Objective: Blood pressure 120/80, height 5' 3.5" (1.613 m), weight 154 lb (69.9 kg).  Gen: NAD HEENT: normocephalic anicteric Ext: no edema Neurologic: grossly intact  Admission on 02/28/2018, Discharged on 02/28/2018  Component Date Value Ref Range Status  . Hemoglobin 02/28/2018 12.7  12.0 - 16.0 g/dL Final   Performed at [facility], [address] [place], [place] [address]  . SURGICAL PATHOLOGY 02/28/2018    Final-Edited                   Value:Surgical Pathology CASE: [id number] PATIENT: [facility] Surgical Pathology Report     SPECIMEN SUBMITTED: A. [place] ECC Endometrial biopsy  CLINICAL HISTORY: None provided  PRE-OPERATIVE DIAGNOSIS: Cervical polyp, post menopausal bleeding  POST-OPERATIVE DIAGNOSIS: Same as pre op     DIAGNOSIS: A.  EMC, ECC AND ENDOMETRIAL BIOPSY; DILATATION AND CURETTAGE: - BENIGN ENDOMETRIAL FRAGMENTS. - FEATURES OF BENIGN ENDOCERVICAL POLYP AND BENIGN ENDOCERVICAL TISSUE. - NEGATIVE FOR ATYPIA AND MALIGNANCY.   GROSS DESCRIPTION: A. Labeled: EMC, ECC, endometrial biopsy Received: In formalin Tissue fragment(s): Multiple Size: Aggregate, 2.5 x 2.2 x 0.1 cm Description: Rubbery pink-tan tissue fragments and mucoid material Entirely submitted in one cassette.     Final Diagnosis performed by Delorse Lek, MD.   Electronically signed 03/01/2018 12:46:31PM The electronic signature indicates that the named Attending Pathologist has evaluated the specimen  [employer]                         l component performed at [facility], [address], [place], [place] [address] Lab: [phone] Dir: Rush Farmer, MD, MMM   Professional component performed at [facility], [address], [place], [place] [address] Lab: [phone] Dir: Dellia Nims. Reuel Derby, MD     Assessment: 62 y.o. s/p hysteroscopy, D&C stable  Plan: Patient has done well after surgery with no apparent complications.  I have discussed the post-operative course to date, and the expected progress moving forward.  The patient understands what complications to be concerned about.  I will see the patient in routine follow up, or sooner if needed.    Activity plan: No restriction.   Malachy Mood, MD, [facility], [facility] 03/19/2018, 9:22 AM

## 2018-04-24 ENCOUNTER — Other Ambulatory Visit: Payer: Self-pay | Admitting: Obstetrics and Gynecology

## 2018-04-24 DIAGNOSIS — Z1231 Encounter for screening mammogram for malignant neoplasm of breast: Secondary | ICD-10-CM

## 2018-07-24 ENCOUNTER — Ambulatory Visit
Admission: RE | Admit: 2018-07-24 | Discharge: 2018-07-24 | Disposition: A | Discharge status: Home / Self Care | Payer: Self-pay | Source: Ambulatory Visit | Attending: Obstetrics and Gynecology | Admitting: Obstetrics and Gynecology

## 2018-07-24 DIAGNOSIS — Z1231 Encounter for screening mammogram for malignant neoplasm of breast: Secondary | ICD-10-CM | POA: Insufficient documentation

## 2018-08-26 ENCOUNTER — Other Ambulatory Visit: Payer: Self-pay | Admitting: Internal Medicine

## 2018-08-26 ENCOUNTER — Ambulatory Visit
Admission: RE | Admit: 2018-08-26 | Discharge: 2018-08-26 | Disposition: A | Discharge status: Home / Self Care | Payer: 59 | Source: Ambulatory Visit | Attending: Internal Medicine | Admitting: Internal Medicine

## 2018-08-26 DIAGNOSIS — J40 Bronchitis, not specified as acute or chronic: Secondary | ICD-10-CM | POA: Insufficient documentation

## 2018-08-26 DIAGNOSIS — J209 Acute bronchitis, unspecified: Secondary | ICD-10-CM | POA: Diagnosis present

## 2018-12-16 ENCOUNTER — Ambulatory Visit (INDEPENDENT_AMBULATORY_CARE_PROVIDER_SITE_OTHER): Payer: BC Managed Care – PPO | Admitting: Obstetrics and Gynecology

## 2018-12-16 ENCOUNTER — Encounter: Payer: Self-pay | Admitting: Obstetrics and Gynecology

## 2018-12-16 VITALS — BP 140/84 | HR 78 | Ht 63.7 in | Wt 164.0 lb

## 2018-12-16 DIAGNOSIS — Z01419 Encounter for gynecological examination (general) (routine) without abnormal findings: Secondary | ICD-10-CM | POA: Diagnosis not present

## 2018-12-16 DIAGNOSIS — Z1239 Encounter for other screening for malignant neoplasm of breast: Secondary | ICD-10-CM

## 2018-12-16 NOTE — Progress Notes (Signed)
Gynecology Annual Exam  PCP: Cletis Athens, MD  Chief Complaint:  Chief Complaint  Patient presents with  . Gynecologic Exam    History of Present Illness:Patient is a 63 y.o. G1P0010 presents for annual exam. The patient has no complaints today.   LMP: No LMP recorded. Patient is postmenopausal. No further post menopausal bleeding  There is no notable family history of breast or ovarian cancer in her family.  The patient wears seatbelts: yes.   The patient has regular exercise: not asked.    The patient denies current symptoms of depression.     Review of Systems: Review of Systems  Constitutional: Negative for chills and fever.  HENT: Negative for congestion.   Respiratory: Negative for cough and shortness of breath.   Cardiovascular: Negative for chest pain and palpitations.  Gastrointestinal: Negative for abdominal pain, constipation, diarrhea, heartburn, nausea and vomiting.  Genitourinary: Negative for dysuria, frequency and urgency.  Skin: Negative for itching and rash.  Neurological: Negative for dizziness and headaches.  Endo/Heme/Allergies: Negative for polydipsia.  Psychiatric/Behavioral: Negative for depression.    Past Medical History:  Past Medical History:  Diagnosis Date  . Anemia   . Eczema   . GERD (gastroesophageal reflux disease)    no meds  . Hemorrhoids   . History of hiatal hernia   . Hypothyroidism   . Migraines    food and stress induced    Past Surgical History:  Past Surgical History:  Procedure Laterality Date  . COLONOSCOPY WITH PROPOFOL N/A 08/24/2016   Procedure: COLONOSCOPY WITH PROPOFOL;  Surgeon: Lucilla Lame, MD;  Location: [facility];  Service: Endoscopy;  Laterality: N/A;  Latex sensitivity  . DILATION AND CURETTAGE OF UTERUS    . HYSTEROSCOPY W/D&C N/A 02/28/2018   Procedure: DILATATION AND CURETTAGE Carl Best;  Surgeon: Malachy Mood, MD;  Location: [facility];  Service: Gynecology;   Laterality: N/A;  . POLYPECTOMY  08/24/2016   Procedure: POLYPECTOMY;  Surgeon: Lucilla Lame, MD;  Location: [place];  Service: Endoscopy;;    Gynecologic History:  No LMP recorded. Patient is postmenopausal. Last Pap: Results were: 05/16/17  no abnormalities  Last mammogram: 07/24/18 Results were: BI-RAD I  Obstetric History: G1P0010  Family History:  Family History  Problem Relation Age of Onset  . Breast cancer Maternal Grandmother     Social History:  Social History   Socioeconomic History  . Marital status: Married    Spouse name: Not on file  . Number of children: Not on file  . Years of education: Not on file  . Highest education level: Not on file  Occupational History  . Not on file  Social Needs  . Financial resource strain: Not on file  . Food insecurity:    Worry: Not on file    Inability: Not on file  . Transportation needs:    Medical: Not on file    Non-medical: Not on file  Tobacco Use  . Smoking status: Former Smoker    Packs/day: 0.50    Types: Cigarettes  . Smokeless tobacco: Never Used  Substance and Sexual Activity  . Alcohol use: No  . Drug use: No  . Sexual activity: Yes    Birth control/protection: None  Lifestyle  . Physical activity:    Days per week: Not on file    Minutes per session: Not on file  . Stress: Not on file  Relationships  . Social connections:    Talks on phone: Not on  file    Gets together: Not on file    Attends religious service: Not on file    Active member of club or organization: Not on file    Attends meetings of clubs or organizations: Not on file    Relationship status: Not on file  . Intimate partner violence:    Fear of current or ex partner: Not on file    Emotionally abused: Not on file    Physically abused: Not on file    Forced sexual activity: Not on file  Other Topics Concern  . Not on file  Social History Narrative  . Not on file    Allergies:  Allergies  Allergen Reactions  .  Milk-Related Compounds     Dairy causes eczema flares and affects breathing (not shortness of breath)   . Penicillins Hives    Has patient had a PCN reaction causing immediate rash, facial/tongue/throat swelling, SOB or lightheadedness with hypotension: No Has patient had a PCN reaction causing severe rash involving mucus membranes or skin necrosis: Yes Has patient had a PCN reaction that required hospitalization: No Has patient had a PCN reaction occurring within the last 10 years: No If all of the above answers are "NO", then may proceed with Cephalosporin use.   . Sulfa Antibiotics Hives  . Latex Hives    Diaphragm, gloves  . Rosin [Pinus Strobus] Hives and Nausea Only    Eczema flare     Medications: Prior to Admission medications   Medication Sig Start Date End Date Taking? Authorizing Provider  cetirizine (ZYRTEC) 10 MG tablet Take 10 mg by mouth daily.   Yes [provider]  conjugated estrogens (PREMARIN) vaginal cream Place 1 Applicatorful vaginally 2 (two) times a week. Patient taking differently: Place 1 Applicatorful vaginally daily as needed (irritation).  08/02/17  Yes Malachy Mood, MD  ferrous sulfate 325 (65 FE) MG tablet Take 325 mg by mouth twice daily 08/04/16  Yes [provider]  hydrocortisone cream 1 % Apply 1 application topically as needed for itching.   Yes [provider]  ibuprofen (ADVIL,MOTRIN) 600 MG tablet Take 1 tablet (600 mg total) by mouth every 6 (six) hours as needed. 02/28/18  Yes Malachy Mood, MD  levothyroxine (SYNTHROID, LEVOTHROID) 100 MCG tablet Take 100 mcg by mouth daily before breakfast.  11/01/17  Yes [provider]  Multiple Vitamin (MULTIVITAMIN WITH MINERALS) TABS tablet Take 1 tablet by mouth daily.   Yes [provider]    Physical Exam Vitals: Blood pressure 140/84, pulse 78, height 5' 3.7" (1.618 m), weight 164 lb (74.4 kg).  General: NAD HEENT: normocephalic,  anicteric Thyroid: no enlargement, no palpable nodules Pulmonary: No increased work of breathing, CTAB Cardiovascular: RRR, distal pulses 2+ Breast: Breast symmetrical, no tenderness, no palpable nodules or masses, no skin or nipple retraction present, no nipple discharge.  No axillary or supraclavicular lymphadenopathy. Abdomen: NABS, soft, non-tender, non-distended.  Umbilicus without lesions.  No hepatomegaly, splenomegaly or masses palpable. No evidence of hernia  Genitourinary:  External: Normal external female genitalia.  Normal urethral meatus, normal Bartholin's and Skene's glands.    Vagina: Normal vaginal mucosa, no evidence of prolapse.    Cervix: Grossly normal in appearance, no bleeding  Uterus: Non-enlarged, mobile, normal contour.  No CMT  Adnexa: ovaries non-enlarged, no adnexal masses  Rectal: deferred  Lymphatic: no evidence of inguinal lymphadenopathy Extremities: no edema, erythema, or tenderness Neurologic: Grossly intact Psychiatric: mood appropriate, affect full  Female chaperone present for pelvic  and breast  portions of the physical exam     Assessment: 63 y.o. G1P0010 routine annual exam  Plan: Problem List Items Addressed This Visit    None    Visit Diagnoses    Breast screening    -  Primary   Encounter for gynecological examination without abnormal finding          1) Mammogram - recommend yearly screening mammogram.  Mammogram Is up to date  2) STI screening  was notoffered and therefore not obtained  3) ASCCP guidelines and rational discussed.  Patient opts for every 3 years screening interval  4) Osteoporosis  - per USPTF routine screening DEXA at age 40  5) Routine healthcare maintenance including cholesterol, diabetes screening discussed managed by PCP  6)  No follow-ups on file.     Malachy Mood, MD Mosetta Pigeon, [facility] 12/16/2018, 10:48 AM

## 2019-05-20 ENCOUNTER — Other Ambulatory Visit: Payer: Self-pay | Admitting: Obstetrics and Gynecology

## 2019-06-19 ENCOUNTER — Other Ambulatory Visit: Payer: Self-pay | Admitting: Internal Medicine

## 2019-06-19 DIAGNOSIS — Z1231 Encounter for screening mammogram for malignant neoplasm of breast: Secondary | ICD-10-CM

## 2020-01-06 ENCOUNTER — Ambulatory Visit
Admission: RE | Admit: 2020-01-06 | Discharge: 2020-01-06 | Disposition: A | Discharge status: Home / Self Care | Payer: BC Managed Care – PPO | Source: Ambulatory Visit | Attending: Internal Medicine | Admitting: Internal Medicine

## 2020-01-06 DIAGNOSIS — Z1231 Encounter for screening mammogram for malignant neoplasm of breast: Secondary | ICD-10-CM | POA: Insufficient documentation

## 2020-01-07 ENCOUNTER — Other Ambulatory Visit: Payer: Self-pay

## 2020-01-07 ENCOUNTER — Ambulatory Visit (INDEPENDENT_AMBULATORY_CARE_PROVIDER_SITE_OTHER): Payer: BC Managed Care – PPO | Admitting: Obstetrics and Gynecology

## 2020-01-07 ENCOUNTER — Other Ambulatory Visit (HOSPITAL_COMMUNITY)
Admission: RE | Admit: 2020-01-07 | Discharge: 2020-01-07 | Disposition: A | Discharge status: Home / Self Care | Payer: BC Managed Care – PPO | Source: Ambulatory Visit | Attending: Obstetrics and Gynecology | Admitting: Obstetrics and Gynecology

## 2020-01-07 VITALS — BP 144/84 | Ht 63.0 in | Wt 166.0 lb

## 2020-01-07 DIAGNOSIS — Z124 Encounter for screening for malignant neoplasm of cervix: Secondary | ICD-10-CM | POA: Insufficient documentation

## 2020-01-07 DIAGNOSIS — Z1239 Encounter for other screening for malignant neoplasm of breast: Secondary | ICD-10-CM

## 2020-01-07 DIAGNOSIS — R928 Other abnormal and inconclusive findings on diagnostic imaging of breast: Secondary | ICD-10-CM

## 2020-01-07 DIAGNOSIS — Z01419 Encounter for gynecological examination (general) (routine) without abnormal findings: Secondary | ICD-10-CM

## 2020-01-07 NOTE — Progress Notes (Signed)
Gynecology Annual Exam  PCP: Cletis Athens, MD  Chief Complaint:  Chief Complaint  Patient presents with  . Annual Exam    History of Present Illness:Patient is a 64 y.o. G1P0010 presents for annual exam. The patient has no complaints today.   LMP: No LMP recorded. Patient is postmenopausal. No PMB  The patient is sexually active. She denies dyspareunia.  The patient does perform self breast exams.  There is no notable family history of breast or ovarian cancer in her family other than maternal grandmother unknown age at diagnosis.  The patient wears seatbelts: yes.   The patient has regular exercise: not asked.    The patient denies current symptoms of depression.     Review of Systems: Review of Systems  Constitutional: Negative for chills and fever.  HENT: Negative for congestion.   Respiratory: Negative for cough and shortness of breath.   Cardiovascular: Negative for chest pain and palpitations.  Gastrointestinal: Negative for abdominal pain, constipation, diarrhea, heartburn, nausea and vomiting.  Genitourinary: Negative for dysuria, frequency and urgency.  Skin: Negative for itching and rash.  Neurological: Negative for dizziness and headaches.  Endo/Heme/Allergies: Negative for polydipsia.  Psychiatric/Behavioral: Negative for depression.    Past Medical History:  Patient Active Problem List   Diagnosis Date Noted  . Postmenopausal bleeding 01/22/2018  . Cervical polyp 01/22/2018  . Hx of colonic polyps   . Benign neoplasm of descending colon   . Polyp of sigmoid colon     Past Surgical History:  Past Surgical History:  Procedure Laterality Date  . COLONOSCOPY WITH PROPOFOL N/A 08/24/2016   Procedure: COLONOSCOPY WITH PROPOFOL;  Surgeon: Lucilla Lame, MD;  Location: [place];  Service: Endoscopy;  Laterality: N/A;  Latex sensitivity  . DILATION AND CURETTAGE OF UTERUS    . HYSTEROSCOPY WITH D & C N/A 02/28/2018   Procedure: DILATATION AND  CURETTAGE Carl Best;  Surgeon: Malachy Mood, MD;  Location: [facility];  Service: Gynecology;  Laterality: N/A;  . POLYPECTOMY  08/24/2016   Procedure: POLYPECTOMY;  Surgeon: Lucilla Lame, MD;  Location: [place];  Service: Endoscopy;;    Gynecologic History:  No LMP recorded. Patient is postmenopausal. Last Pap: Results were: 05/16/2017 NIL and HR HPV negative  Last mammogram:  01/06/2020 BIRAD-0 Right breast 07/24/2018 Results were: BI-RAD I  Obstetric History: G1P0010  Family History:  Family History  Problem Relation Age of Onset  . Breast cancer Maternal Grandmother     Social History:  Social History   Socioeconomic History  . Marital status: Married    Spouse name: Not on file  . Number of children: Not on file  . Years of education: Not on file  . Highest education level: Not on file  Occupational History  . Not on file  Tobacco Use  . Smoking status: Former Smoker    Packs/day: 0.50    Types: Cigarettes  . Smokeless tobacco: Never Used  Substance and Sexual Activity  . Alcohol use: No  . Drug use: No  . Sexual activity: Yes    Birth control/protection: None  Other Topics Concern  . Not on file  Social History Narrative  . Not on file   Social Determinants of Health   Financial Resource Strain:   . Difficulty of Paying Living Expenses:   Food Insecurity:   . Worried About Charity fundraiser in the Last Year:   . Arboriculturist in the Last Year:   [employer]  Needs:   . Lack of Transportation (Medical):   Marland Kitchen Lack of Transportation (Non-Medical):   Physical Activity:   . Days of Exercise per Week:   . Minutes of Exercise per Session:   Stress:   . Feeling of Stress :   Social Connections:   . Frequency of Communication with Friends and Family:   . Frequency of Social Gatherings with Friends and Family:   . Attends Religious Services:   . Active Member of Clubs or Organizations:   . Attends Archivist  Meetings:   Marland Kitchen Marital Status:   Intimate Partner Violence:   . Fear of Current or Ex-Partner:   . Emotionally Abused:   Marland Kitchen Physically Abused:   . Sexually Abused:     Allergies:  Allergies  Allergen Reactions  . Milk-Related Compounds     Dairy causes eczema flares and affects breathing (not shortness of breath)   . Penicillins Hives    Has patient had a PCN reaction causing immediate rash, facial/tongue/throat swelling, SOB or lightheadedness with hypotension: No Has patient had a PCN reaction causing severe rash involving mucus membranes or skin necrosis: Yes Has patient had a PCN reaction that required hospitalization: No Has patient had a PCN reaction occurring within the last 10 years: No If all of the above answers are "NO", then may proceed with Cephalosporin use.   . Sulfa Antibiotics Hives  . Latex Hives    Diaphragm, gloves  . Rosin [Pinus Strobus] Hives and Nausea Only    Eczema flare     Medications: Prior to Admission medications   Medication Sig Start Date End Date Taking? Authorizing Provider  cetirizine (ZYRTEC) 10 MG tablet Take 10 mg by mouth daily.   Yes [provider]  ferrous sulfate 325 (65 FE) MG tablet Take 325 mg by mouth twice daily 08/04/16  Yes [provider]  hydrocortisone cream 1 % Apply 1 application topically as needed for itching.   Yes [provider]  ibuprofen (ADVIL,MOTRIN) 600 MG tablet Take 1 tablet (600 mg total) by mouth every 6 (six) hours as needed. 02/28/18  Yes Malachy Mood, MD  levothyroxine (SYNTHROID, LEVOTHROID) 100 MCG tablet Take 100 mcg by mouth daily before breakfast.  11/01/17  Yes [provider]  Multiple Vitamin (MULTIVITAMIN WITH MINERALS) TABS tablet Take 1 tablet by mouth daily.   Yes [provider]  PREMARIN vaginal cream PLACE 1 APPLICATORFUL VAGINALLY 2 (TWO) TIMES A WEEK. 05/20/19  Yes Malachy Mood, MD    Physical Exam Vitals: Blood pressure (!) 144/84, height  5\' 3"  (1.6 m), weight 166 lb (75.3 kg).  General: NAD HEENT: normocephalic, anicteric Thyroid: no enlargement, no palpable nodules Pulmonary: No increased work of breathing, CTAB Cardiovascular: RRR, distal pulses 2+ Breast: Breast symmetrical, no tenderness, no palpable nodules or masses, no skin or nipple retraction present, no nipple discharge.  No axillary or supraclavicular lymphadenopathy. Abdomen: NABS, soft, non-tender, non-distended.  Umbilicus without lesions.  No hepatomegaly, splenomegaly or masses palpable. No evidence of hernia  Genitourinary:  External: Normal external female genitalia.  Normal urethral meatus, normal Bartholin's and Skene's glands.    Vagina: Normal vaginal mucosa, no evidence of prolapse.    Cervix: Grossly normal in appearance, no bleeding  Uterus: Non-enlarged, mobile, normal contour.  No CMT  Adnexa: ovaries non-enlarged, no adnexal masses  Rectal: deferred  Lymphatic: no evidence of inguinal lymphadenopathy Extremities: no edema, erythema, or tenderness Neurologic: Grossly intact Psychiatric: mood appropriate, affect full  Female chaperone present  for pelvic and breast  portions of the physical exam     Assessment: 64 y.o. G1P0010 routine annual exam  Plan: Problem List Items Addressed This Visit    None    Visit Diagnoses    Encounter for gynecological examination without abnormal finding    -  Primary   Screening for malignant neoplasm of cervix       Relevant Orders   Cytology - PAP   Breast screening       Abnormal screening mammogram       Relevant Orders   US BREAST COMPLETE UNI RIGHT INC AXILLA   MM DIAG BREAST TOMO UNI RIGHT      1) Mammogram - recommend yearly screening mammogram.  Mammogram Is up to date but incomplete. - additional views ordered today  2) STI screening  was notoffered and therefore not obtained  3) ASCCP guidelines and rational discussed.  Patient opts for every 3 years screening interval  4)  Osteoporosis  - per USPTF routine screening DEXA at age 64  5) Routine healthcare maintenance including cholesterol, diabetes screening discussed managed by PCP  6) Colonoscopy - UTD 08/24/2016  7) Return in about 1 year (around 01/06/2021) for annual.    Malachy Mood, MD Mosetta Pigeon, [facility] 01/07/2020, 9:05 AM

## 2020-01-09 LAB — CYTOLOGY - PAP
Comment: NEGATIVE
Diagnosis: NEGATIVE
Diagnosis: REACTIVE
High risk HPV: NEGATIVE

## 2020-01-13 ENCOUNTER — Ambulatory Visit
Admission: RE | Admit: 2020-01-13 | Discharge: 2020-01-13 | Disposition: A | Discharge status: Home / Self Care | Payer: BC Managed Care – PPO | Source: Ambulatory Visit | Attending: Obstetrics and Gynecology | Admitting: Obstetrics and Gynecology

## 2020-01-13 DIAGNOSIS — R928 Other abnormal and inconclusive findings on diagnostic imaging of breast: Secondary | ICD-10-CM | POA: Diagnosis present

## 2020-04-22 ENCOUNTER — Encounter: Payer: Self-pay | Admitting: Internal Medicine

## 2020-04-22 ENCOUNTER — Other Ambulatory Visit: Payer: Self-pay

## 2020-04-22 ENCOUNTER — Ambulatory Visit (INDEPENDENT_AMBULATORY_CARE_PROVIDER_SITE_OTHER): Payer: BC Managed Care – PPO | Admitting: Internal Medicine

## 2020-04-22 VITALS — BP 133/87 | HR 73 | Ht 63.0 in | Wt 170.6 lb

## 2020-04-22 DIAGNOSIS — D124 Benign neoplasm of descending colon: Secondary | ICD-10-CM

## 2020-04-22 DIAGNOSIS — E039 Hypothyroidism, unspecified: Secondary | ICD-10-CM | POA: Insufficient documentation

## 2020-04-22 DIAGNOSIS — Z13220 Encounter for screening for lipoid disorders: Secondary | ICD-10-CM | POA: Diagnosis not present

## 2020-04-22 DIAGNOSIS — Z86010 Personal history of colonic polyps: Secondary | ICD-10-CM | POA: Diagnosis not present

## 2020-04-22 DIAGNOSIS — Z0000 Encounter for general adult medical examination without abnormal findings: Secondary | ICD-10-CM

## 2020-04-22 DIAGNOSIS — E079 Disorder of thyroid, unspecified: Secondary | ICD-10-CM

## 2020-04-22 NOTE — Assessment & Plan Note (Signed)
Patient needs follow-up colonoscopy

## 2020-04-22 NOTE — Progress Notes (Signed)
Established Patient Office Visit  SUBJECTIVE:  Subjective  Patient ID: Barbara Walter, female    DOB: 12-13-1955  Age: 64 y.o. MRN: [id number]  CC:  Chief Complaint  Patient presents with  . Annual Exam    HPI Barbara Walter is a 64 y.o. female presenting today for an annual exam.  She notes that she recently went on the weight watchers diet several months ago and tried to lose weight for three months. She just got a bike for exercise and notes that she has a fairly healthy diet; she eats fruits and vegetables primarily. She does have some concern about her weight and wants to get it under control.   She is up to date with her PAP smear and mammography. Her PAP smear was on 01/07/2020, and had a normal result. Her routine mammography was on 01/06/2020 and they found asymmetry in the right breast; diagnostic mammography and ultrasound revealed no abnormalities. She is up to date with her optometry exams; she wears glasses when she drives. Her last colonoscopy was on 08/24/2016, and they found two polyps.   She is fully vaccinated against COVID-19.    Past Medical History:  Diagnosis Date  . Anemia   . Eczema   . GERD (gastroesophageal reflux disease)    no meds  . Hemorrhoids   . History of hiatal hernia   . Hypothyroidism   . Migraines    food and stress induced    Past Surgical History:  Procedure Laterality Date  . COLONOSCOPY WITH PROPOFOL N/A 08/24/2016   Procedure: COLONOSCOPY WITH PROPOFOL;  Surgeon: Lucilla Lame, MD;  Location: [facility];  Service: Endoscopy;  Laterality: N/A;  Latex sensitivity  . DILATION AND CURETTAGE OF UTERUS    . HYSTEROSCOPY WITH D & C N/A 02/28/2018   Procedure: DILATATION AND CURETTAGE Carl Best;  Surgeon: Malachy Mood, MD;  Location: [facility];  Service: Gynecology;  Laterality: N/A;  . POLYPECTOMY  08/24/2016   Procedure: POLYPECTOMY;  Surgeon: Lucilla Lame, MD;  Location: [place];  Service:  Endoscopy;;    Family History  Problem Relation Age of Onset  . Breast cancer Maternal Grandmother     Social History   Socioeconomic History  . Marital status: Married    Spouse name: Not on file  . Number of children: Not on file  . Years of education: Not on file  . Highest education level: Not on file  Occupational History  . Not on file  Tobacco Use  . Smoking status: Former Smoker    Packs/day: 0.50    Types: Cigarettes  . Smokeless tobacco: Never Used  Vaping Use  . Vaping Use: Never used  Substance and Sexual Activity  . Alcohol use: No  . Drug use: No  . Sexual activity: Yes    Birth control/protection: None  Other Topics Concern  . Not on file  Social History Narrative  . Not on file   Social Determinants of Health   Financial Resource Strain:   . Difficulty of Paying Living Expenses:   Food Insecurity:   . Worried About Charity fundraiser in the Last Year:   . Arboriculturist in the Last Year:   Transportation Needs:   . Film/video editor (Medical):   Marland Kitchen Lack of Transportation (Non-Medical):   Physical Activity:   . Days of Exercise per Week:   . Minutes of Exercise per Session:   Stress:   . Feeling of Stress :  Social Connections:   . Frequency of Communication with Friends and Family:   . Frequency of Social Gatherings with Friends and Family:   . Attends Religious Services:   . Active Member of Clubs or Organizations:   . Attends Archivist Meetings:   Marland Kitchen Marital Status:   Intimate Partner Violence:   . Fear of Current or Ex-Partner:   . Emotionally Abused:   Marland Kitchen Physically Abused:   . Sexually Abused:      Current Outpatient Medications:  .  cetirizine (ZYRTEC) 10 MG tablet, Take 10 mg by mouth daily., Disp: , Rfl:  .  ferrous sulfate 325 (65 FE) MG tablet, Take 325 mg by mouth twice daily, Disp: , Rfl: 6 .  hydrocortisone cream 1 %, Apply 1 application topically as needed for itching., Disp: , Rfl:  .  levothyroxine  (SYNTHROID, LEVOTHROID) 100 MCG tablet, Take 100 mcg by mouth daily before breakfast. , Disp: , Rfl: 5 .  PREMARIN vaginal cream, PLACE 1 APPLICATORFUL VAGINALLY 2 (TWO) TIMES A WEEK., Disp: 30 g, Rfl: 3   Allergies  Allergen Reactions  . Milk-Related Compounds     Dairy causes eczema flares and affects breathing (not shortness of breath)   . Penicillins Hives    Has patient had a PCN reaction causing immediate rash, facial/tongue/throat swelling, SOB or lightheadedness with hypotension: No Has patient had a PCN reaction causing severe rash involving mucus membranes or skin necrosis: Yes Has patient had a PCN reaction that required hospitalization: No Has patient had a PCN reaction occurring within the last 10 years: No If all of the above answers are "NO", then may proceed with Cephalosporin use.   . Sulfa Antibiotics Hives  . Latex Hives    Diaphragm, gloves  . Rosin [Pinus Strobus] Hives and Nausea Only    Eczema flare     ROS Review of Systems  Constitutional: Negative.  Negative for appetite change and fatigue.  HENT: Negative.   Eyes: Negative.   Respiratory: Negative.  Negative for shortness of breath.   Cardiovascular: Negative.  Negative for chest pain and leg swelling.  Gastrointestinal: Negative.   Endocrine: Negative.   Genitourinary: Negative.   Musculoskeletal: Negative.   Skin: Negative.   Allergic/Immunologic: Positive for environmental allergies.  Neurological: Negative.   Hematological: Negative.   Psychiatric/Behavioral: Negative.   All other systems reviewed and are negative.    OBJECTIVE:    Physical Exam Vitals reviewed. Exam conducted with a chaperone present.  Constitutional:      Appearance: Normal appearance.  HENT:     Mouth/Throat:     Mouth: Mucous membranes are moist.  Eyes:     Pupils: Pupils are equal, round, and reactive to light.  Cardiovascular:     Rate and Rhythm: Normal rate and regular rhythm.     Pulses: Normal pulses.      Heart sounds: Normal heart sounds.  Pulmonary:     Effort: Pulmonary effort is normal.     Breath sounds: Normal breath sounds.  Chest:     Breasts:        Right: Normal.        Left: Normal.     Comments: Lipoma present just under left costal margin Abdominal:     Palpations: There is no hepatomegaly, splenomegaly or mass.     Tenderness: There is no abdominal tenderness.  Musculoskeletal:     Right lower leg: No edema.     Left lower leg: No edema.  Neurological:  Mental Status: She is alert and oriented to person, place, and time.     Coordination: Coordination is intact.     Gait: Gait is intact.  Psychiatric:        Mood and Affect: Mood and affect normal.        Behavior: Behavior normal.     BP 133/87   Pulse 73   Ht '5\' 3"'  (1.6 m)   Wt 170 lb 9.6 oz (77.4 kg)   BMI 30.22 kg/m  Wt Readings from Last 3 Encounters:  04/22/20 170 lb 9.6 oz (77.4 kg)  01/07/20 166 lb (75.3 kg)  12/16/18 164 lb (74.4 kg)    Health Maintenance Due  Topic Date Due  . Hepatitis C Screening  Never done  . COVID-19 Vaccine (1) Never done  . HIV Screening  Never done  . TETANUS/TDAP  Never done    There are no preventive care reminders to display for this patient.  CBC Latest Ref Rng & Units 02/28/2018  Hemoglobin 12.0 - 16.0 g/dL 12.7   No flowsheet data found.  No results found for: TSH No results found for: ALBUMIN, ANIONGAP, EGFR, GFR No results found for: CHOL, HDL, LDLCALC, CHOLHDL No results found for: TRIG No results found for: HGBA1C    ASSESSMENT & PLAN:   Problem List Items Addressed This Visit      Digestive   Benign neoplasm of descending colon    Patient needs follow-up colonoscopy.      Relevant Orders   CBC with Differential/Platelet   COMPLETE METABOLIC PANEL WITH GFR     Endocrine   Thyroid disease    check  tsh      Relevant Orders   TSH   T3   T4     Other   Hx of colonic polyps    Ref to gi      Annual physical exam - Primary     Patient physical exam is normal  she borderline obese, advised to lose weight she is exercising on a regular basis  .shedoes not smoke does not drink.  she was advised to contact the pharmacy so she can get. [place]         No orders of the defined types were placed in this encounter.     Follow-up: No follow-ups on file.    Dr. Jane [facility] [address] [place], [place] [address]   By signing my name below, I, [employer], attest that this documentation has been prepared under the direction and in the presence of Cletis Athens, MD. Electronically Signed: Cletis Athens, MD 04/22/20, 5:43 PM   I personally performed the services described in this documentation, which was SCRIBED in my presence. The recorded information has been reviewed and considered accurate. It has been edited as necessary during review. Cletis Athens, MD

## 2020-04-22 NOTE — Assessment & Plan Note (Signed)
Patient physical exam is normal  she borderline obese, advised to lose weight she is exercising on a regular basis  .shedoes not smoke does not drink.  she was advised to contact the pharmacy so she can get. [place]

## 2020-04-22 NOTE — Assessment & Plan Note (Signed)
Ref to gi 

## 2020-04-22 NOTE — Assessment & Plan Note (Signed)
check tsh 

## 2020-04-23 LAB — CBC WITH DIFFERENTIAL/PLATELET
Absolute Monocytes: 530 cells/uL (ref 200–950)
Basophils Absolute: 62 cells/uL (ref 0–200)
Basophils Relative: 1.2 %
Eosinophils Absolute: 130 cells/uL (ref 15–500)
Eosinophils Relative: 2.5 %
HCT: 44 % (ref 35.0–45.0)
Hemoglobin: 14.4 g/dL (ref 11.7–15.5)
Lymphs Abs: 1633 cells/uL (ref 850–3900)
MCH: 29.1 pg (ref 27.0–33.0)
MCHC: 32.7 g/dL (ref 32.0–36.0)
MCV: 89.1 fL (ref 80.0–100.0)
MPV: 10.4 fL (ref 7.5–12.5)
Monocytes Relative: 10.2 %
Neutro Abs: 2844 cells/uL (ref 1500–7800)
Neutrophils Relative %: 54.7 %
Platelets: 256 10*3/uL (ref 140–400)
RBC: 4.94 10*6/uL (ref 3.80–5.10)
RDW: 13.1 % (ref 11.0–15.0)
Total Lymphocyte: 31.4 %
WBC: 5.2 10*3/uL (ref 3.8–10.8)

## 2020-04-23 LAB — COMPLETE METABOLIC PANEL WITH GFR
AG Ratio: 1.6 (calc) (ref 1.0–2.5)
ALT: 18 U/L (ref 6–29)
AST: 19 U/L (ref 10–35)
Albumin: 4.5 g/dL (ref 3.6–5.1)
Alkaline phosphatase (APISO): 58 U/L (ref 37–153)
BUN: 21 mg/dL (ref 7–25)
CO2: 18 mmol/L — ABNORMAL LOW (ref 20–32)
Calcium: 9.6 mg/dL (ref 8.6–10.4)
Chloride: 108 mmol/L (ref 98–110)
Creat: 0.78 mg/dL (ref 0.50–0.99)
GFR, Est African American: 94 mL/min/{1.73_m2} (ref >=60)
GFR, Est Non African American: 81 mL/min/{1.73_m2} (ref >=60)
Globulin: 2.8 g/dL (calc) (ref 1.9–3.7)
Glucose, Bld: 91 mg/dL (ref 65–99)
Potassium: 4.7 mmol/L (ref 3.5–5.3)
Sodium: 145 mmol/L (ref 135–146)
Total Bilirubin: 0.4 mg/dL (ref 0.2–1.2)
Total Protein: 7.3 g/dL (ref 6.1–8.1)

## 2020-04-23 LAB — LIPID PANEL
Cholesterol: 215 mg/dL — ABNORMAL HIGH (ref <200)
HDL: 48 mg/dL — ABNORMAL LOW (ref >=50)
LDL Cholesterol (Calc): 142 mg/dL (calc) — ABNORMAL HIGH
Non-HDL Cholesterol (Calc): 167 mg/dL (calc) — ABNORMAL HIGH (ref <130)
Total CHOL/HDL Ratio: 4.5 (calc) (ref <5.0)
Triglycerides: 125 mg/dL (ref <150)

## 2020-04-23 LAB — T4: T4, Total: 9.3 ug/dL (ref 5.1–11.9)

## 2020-04-23 LAB — T3: T3, Total: 95 ng/dL (ref 76–181)

## 2020-04-23 LAB — TSH: TSH: 1.41 mIU/L (ref 0.40–4.50)

## 2020-04-30 NOTE — Addendum Note (Signed)
Addended by: Cletis Athens on: 04/30/2020 12:12 PM   Modules accepted: Level of Service

## 2020-05-03 ENCOUNTER — Ambulatory Visit (INDEPENDENT_AMBULATORY_CARE_PROVIDER_SITE_OTHER): Payer: BC Managed Care – PPO | Admitting: Internal Medicine

## 2020-05-03 ENCOUNTER — Other Ambulatory Visit: Payer: Self-pay

## 2020-05-03 ENCOUNTER — Encounter: Payer: Self-pay | Admitting: Internal Medicine

## 2020-05-03 VITALS — BP 145/83 | HR 78 | Ht 63.0 in | Wt 173.0 lb

## 2020-05-03 DIAGNOSIS — N950 Postmenopausal bleeding: Secondary | ICD-10-CM

## 2020-05-03 DIAGNOSIS — E079 Disorder of thyroid, unspecified: Secondary | ICD-10-CM

## 2020-05-03 DIAGNOSIS — K635 Polyp of colon: Secondary | ICD-10-CM | POA: Diagnosis not present

## 2020-05-03 DIAGNOSIS — G43709 Chronic migraine without aura, not intractable, without status migrainosus: Secondary | ICD-10-CM | POA: Diagnosis not present

## 2020-05-03 MED ORDER — LEVOTHYROXINE SODIUM 100 MCG PO TABS: 100.0000 ug | ORAL_TABLET | Freq: Every day | ORAL | 3 refills | Status: DC

## 2020-05-03 NOTE — Progress Notes (Signed)
Established Patient Office Visit  SUBJECTIVE:  Subjective  Patient ID: Barbara Walter, female    DOB: 21-May-1956  Age: 64 y.o. MRN: [id number]  CC:  Chief Complaint  Patient presents with  . Lab results    HPI Barbara Walter is a 64 y.o. female presenting today to discuss her recent lab results from 04/22/2020.  Her CBC was normal. Her CMP was normal except for her CO2 18. TSH was normal at 1.41. T3 was normal at 95. T4 was normal at 9.3. Her lipid panel was normal except for Cholesterol of 215, HDL 48, LDL 142, Non-HDL Cholesterol 167.   She notes that her husband got her a nice bike and she has been riding it almost everywhere.   She notes that for breakfast she had oatmeal with apples pieces. For a snack she ate a small banana.    Past Medical History:  Diagnosis Date  . Anemia   . Eczema   . GERD (gastroesophageal reflux disease)    no meds  . Hemorrhoids   . History of hiatal hernia   . Hypothyroidism   . Migraines    food and stress induced    Past Surgical History:  Procedure Laterality Date  . COLONOSCOPY WITH PROPOFOL N/A 08/24/2016   Procedure: COLONOSCOPY WITH PROPOFOL;  Surgeon: Lucilla Lame, MD;  Location: [place];  Service: Endoscopy;  Laterality: N/A;  Latex sensitivity  . DILATION AND CURETTAGE OF UTERUS    . HYSTEROSCOPY WITH D & C N/A 02/28/2018   Procedure: DILATATION AND CURETTAGE Carl Best;  Surgeon: Malachy Mood, MD;  Location: [facility];  Service: Gynecology;  Laterality: N/A;  . POLYPECTOMY  08/24/2016   Procedure: POLYPECTOMY;  Surgeon: Lucilla Lame, MD;  Location: [place];  Service: Endoscopy;;    Family History  Problem Relation Age of Onset  . Breast cancer Maternal Grandmother     Social History   Socioeconomic History  . Marital status: Married    Spouse name: Not on file  . Number of children: Not on file  . Years of education: Not on file  . Highest education level: Not on file    Occupational History  . Not on file  Tobacco Use  . Smoking status: Former Smoker    Packs/day: 0.50    Types: Cigarettes  . Smokeless tobacco: Never Used  Vaping Use  . Vaping Use: Never used  Substance and Sexual Activity  . Alcohol use: No  . Drug use: No  . Sexual activity: Yes    Birth control/protection: None  Other Topics Concern  . Not on file  Social History Narrative  . Not on file   Social Determinants of Health   Financial Resource Strain:   . Difficulty of Paying Living Expenses:   Food Insecurity:   . Worried About Charity fundraiser in the Last Year:   . Arboriculturist in the Last Year:   Transportation Needs:   . Film/video editor (Medical):   Marland Kitchen Lack of Transportation (Non-Medical):   Physical Activity:   . Days of Exercise per Week:   . Minutes of Exercise per Session:   Stress:   . Feeling of Stress :   Social Connections:   . Frequency of Communication with Friends and Family:   . Frequency of Social Gatherings with Friends and Family:   . Attends Religious Services:   . Active Member of Clubs or Organizations:   . Attends Club or  Organization Meetings:   Marland Kitchen Marital Status:   Intimate Partner Violence:   . Fear of Current or Ex-Partner:   . Emotionally Abused:   Marland Kitchen Physically Abused:   . Sexually Abused:      Current Outpatient Medications:  .  cetirizine (ZYRTEC) 10 MG tablet, Take 10 mg by mouth daily., Disp: , Rfl:  .  ferrous sulfate 325 (65 FE) MG tablet, Take 325 mg by mouth twice daily, Disp: , Rfl: 6 .  hydrocortisone cream 1 %, Apply 1 application topically as needed for itching., Disp: , Rfl:  .  PREMARIN vaginal cream, PLACE 1 APPLICATORFUL VAGINALLY 2 (TWO) TIMES A WEEK., Disp: 30 g, Rfl: 3 .  levothyroxine (SYNTHROID) 100 MCG tablet, Take 1 tablet (100 mcg total) by mouth daily before breakfast., Disp: 90 tablet, Rfl: 3   Allergies  Allergen Reactions  . Milk-Related Compounds     Dairy causes eczema flares and affects  breathing (not shortness of breath)   . Penicillins Hives    Has patient had a PCN reaction causing immediate rash, facial/tongue/throat swelling, SOB or lightheadedness with hypotension: No Has patient had a PCN reaction causing severe rash involving mucus membranes or skin necrosis: Yes Has patient had a PCN reaction that required hospitalization: No Has patient had a PCN reaction occurring within the last 10 years: No If all of the above answers are "NO", then may proceed with Cephalosporin use.   . Sulfa Antibiotics Hives  . Latex Hives    Diaphragm, gloves  . Rosin [Pinus Strobus] Hives and Nausea Only    Eczema flare     ROS Review of Systems  Constitutional: Negative.   HENT: Negative.   Eyes: Negative.   Respiratory: Negative.  Negative for shortness of breath and wheezing.   Cardiovascular: Negative.  Negative for chest pain and palpitations.  Gastrointestinal: Negative.   Endocrine: Negative.   Genitourinary: Negative.   Musculoskeletal: Negative.   Skin: Negative.   Allergic/Immunologic: Negative.   Neurological: Negative.   Hematological: Negative.   Psychiatric/Behavioral: Negative.   All other systems reviewed and are negative.    OBJECTIVE:    Physical Exam Vitals reviewed.  Constitutional:      Appearance: Normal appearance.  HENT:     Mouth/Throat:     Mouth: Mucous membranes are moist.  Eyes:     Pupils: Pupils are equal, round, and reactive to light.  Cardiovascular:     Rate and Rhythm: Normal rate and regular rhythm.     Pulses: Normal pulses.     Heart sounds: Normal heart sounds.  Pulmonary:     Effort: Pulmonary effort is normal.     Breath sounds: Normal breath sounds.  Abdominal:     Palpations: There is no hepatomegaly, splenomegaly or mass.     Tenderness: There is no abdominal tenderness.  Musculoskeletal:     Right lower leg: No edema.     Left lower leg: No edema.  Neurological:     Mental Status: She is alert and oriented to  person, place, and time.  Psychiatric:        Mood and Affect: Mood and affect normal.        Behavior: Behavior normal.     BP (!) 145/83   Pulse 78   Ht '5\' 3"'  (1.6 m)   Wt 173 lb (78.5 kg)   BMI 30.65 kg/m  Wt Readings from Last 3 Encounters:  05/03/20 173 lb (78.5 kg)  04/22/20 170 lb 9.6 oz (  77.4 kg)  01/07/20 166 lb (75.3 kg)    Health Maintenance Due  Topic Date Due  . Hepatitis C Screening  Never done  . COVID-19 Vaccine (1) Never done  . HIV Screening  Never done  . TETANUS/TDAP  Never done    There are no preventive care reminders to display for this patient.  CBC Latest Ref Rng & Units 04/22/2020 02/28/2018  WBC 3.8 - 10.8 Thousand/uL 5.2 -  Hemoglobin 11.7 - 15.5 g/dL 14.4 12.7  Hematocrit 35 - 45 % 44.0 -  Platelets 140 - 400 Thousand/uL 256 -   CMP Latest Ref Rng & Units 04/22/2020  Glucose 65 - 99 mg/dL 91  BUN 7 - 25 mg/dL 21  Creatinine 0.50 - 0.99 mg/dL 0.78  Sodium 135 - 146 mmol/L 145  Potassium 3.5 - 5.3 mmol/L 4.7  Chloride 98 - 110 mmol/L 108  CO2 20 - 32 mmol/L 18(L)  Calcium 8.6 - 10.4 mg/dL 9.6  Total Protein 6.1 - 8.1 g/dL 7.3  Total Bilirubin 0.2 - 1.2 mg/dL 0.4  AST 10 - 35 U/L 19  ALT 6 - 29 U/L 18    Lab Results  Component Value Date   TSH 1.41 04/22/2020   No results found for: ALBUMIN, ANIONGAP, EGFR, GFR Lab Results  Component Value Date   CHOL 215 (H) 04/22/2020   HDL 48 (L) 04/22/2020   LDLCALC 142 (H) 04/22/2020   CHOLHDL 4.5 04/22/2020   Lab Results  Component Value Date   TRIG 125 04/22/2020   No results found for: HGBA1C    ASSESSMENT & PLAN:   Problem List Items Addressed This Visit      Cardiovascular and Mediastinum   Migraines    Migraine headaches are stable at the present time.        Digestive   Polyp of sigmoid colon    Suggest follow-up colonoscopy every 4 to 5 years        Endocrine   Hypothyroidism    Recent lab test shows TSH is normal.        Other   Postmenopausal bleeding -  Primary    she has a history of cervical polyp.  Hormonal vaginal cream is helping her for the bleeding.  She was advised to follow-up with OB/GYN.   hematocrit is stable.         No orders of the defined types were placed in this encounter.   Patient's labs are normal  hemoglobin hematocrit is stable.  She was advised to report any further bleeding.. Advised to follow low-cholesterol diet.  Follow-up: No follow-ups on file.    Dr. Jane Canary [facility] [address], [place], [place] [address]   By signing my name below, I, [employer], attest that this documentation has been prepared under the direction and in the presence of Cletis Athens, MD. Electronically Signed: Cletis Athens, MD 05/05/20, 7:51 PM    I personally performed the services described in this documentation, which was SCRIBED in my presence. The recorded information has been reviewed and considered accurate. It has been edited as necessary during review. Cletis Athens, MD

## 2020-05-05 ENCOUNTER — Encounter: Payer: Self-pay | Admitting: Internal Medicine

## 2020-05-05 DIAGNOSIS — G43909 Migraine, unspecified, not intractable, without status migrainosus: Secondary | ICD-10-CM | POA: Insufficient documentation

## 2020-05-05 NOTE — Assessment & Plan Note (Signed)
Suggest follow-up colonoscopy every 4 to 5 years

## 2020-05-05 NOTE — Assessment & Plan Note (Signed)
Recent lab test shows TSH is normal.

## 2020-05-05 NOTE — Assessment & Plan Note (Signed)
Migraine headaches are stable at the present time.

## 2020-05-05 NOTE — Assessment & Plan Note (Addendum)
she has a history of cervical polyp.  Hormonal vaginal cream is helping her for the bleeding.  She was advised to follow-up with OB/GYN.   hematocrit is stable.

## 2020-05-11 ENCOUNTER — Other Ambulatory Visit: Payer: Self-pay | Admitting: Obstetrics and Gynecology

## 2020-05-11 NOTE — Telephone Encounter (Signed)
Advise

## 2020-05-13 ENCOUNTER — Other Ambulatory Visit: Payer: Self-pay | Admitting: Obstetrics and Gynecology

## 2020-05-13 MED ORDER — ESTRADIOL 0.1 MG/GM VA CREA: 1.0000 g | TOPICAL_CREAM | VAGINAL | 3 refills | Status: DC

## 2020-11-29 ENCOUNTER — Other Ambulatory Visit: Payer: Self-pay | Admitting: Obstetrics and Gynecology

## 2020-11-29 ENCOUNTER — Telehealth: Payer: Self-pay

## 2020-11-29 ENCOUNTER — Other Ambulatory Visit: Payer: Self-pay | Admitting: Internal Medicine

## 2020-11-29 DIAGNOSIS — Z1231 Encounter for screening mammogram for malignant neoplasm of breast: Secondary | ICD-10-CM

## 2020-11-29 NOTE — Telephone Encounter (Signed)
Patient is needing an order for her mammogram. Please advise

## 2020-11-29 NOTE — Telephone Encounter (Signed)
Order is in   [facility] [place] [address]  [facility]  [address]. [place] [address]  Phone: [phone]

## 2020-11-29 NOTE — Telephone Encounter (Signed)
Please advise 

## 2021-01-06 ENCOUNTER — Ambulatory Visit
Admission: RE | Admit: 2021-01-06 | Discharge: 2021-01-06 | Disposition: A | Discharge status: Home / Self Care | Payer: BC Managed Care – PPO | Source: Ambulatory Visit | Attending: Obstetrics and Gynecology | Admitting: Obstetrics and Gynecology

## 2021-01-06 ENCOUNTER — Other Ambulatory Visit: Payer: Self-pay

## 2021-01-06 DIAGNOSIS — Z1231 Encounter for screening mammogram for malignant neoplasm of breast: Secondary | ICD-10-CM | POA: Insufficient documentation

## 2021-01-07 ENCOUNTER — Encounter: Payer: Self-pay | Admitting: Obstetrics and Gynecology

## 2021-01-07 ENCOUNTER — Ambulatory Visit (INDEPENDENT_AMBULATORY_CARE_PROVIDER_SITE_OTHER): Payer: BC Managed Care – PPO | Admitting: Obstetrics and Gynecology

## 2021-01-07 VITALS — BP 144/82 | Ht 63.25 in | Wt 180.0 lb

## 2021-01-07 DIAGNOSIS — Z4889 Encounter for other specified surgical aftercare: Secondary | ICD-10-CM | POA: Diagnosis not present

## 2021-01-07 NOTE — Progress Notes (Signed)
Gynecology Annual Exam  PCP: Cletis Athens, MD  Chief Complaint:  Chief Complaint  Patient presents with  . Gynecologic Exam    Annual - concerns about weight gain. RM 4    History of Present Illness:Patient is a 65 y.o. G1P0010 presents for annual exam. The patient has no complaints today.   LMP: No LMP recorded. Patient is postmenopausal. No postmenopausal bleeding.  The patient is sexually active. She denies dyspareunia.  The patient does perform self breast exams.  There is no notable family history of breast or ovarian cancer in her family.  The patient wears seatbelts: yes.   The patient has regular exercise: no.    The patient denies current symptoms of depression.     Review of Systems: Review of Systems  Constitutional: Negative.   Gastrointestinal: Negative.   Genitourinary: Negative.     Past Medical History:  Patient Active Problem List   Diagnosis Date Noted  . Migraines     food and stress induced   . Annual physical exam 04/22/2020  . Hypothyroidism 04/22/2020  . Postmenopausal bleeding 01/22/2018  . Cervical polyp 01/22/2018  . Hx of colonic polyps   . Benign neoplasm of descending colon   . Polyp of sigmoid colon     Past Surgical History:  Past Surgical History:  Procedure Laterality Date  . COLONOSCOPY WITH PROPOFOL N/A 08/24/2016   Procedure: COLONOSCOPY WITH PROPOFOL;  Surgeon: Lucilla Lame, MD;  Location: [place];  Service: Endoscopy;  Laterality: N/A;  Latex sensitivity  . DILATION AND CURETTAGE OF UTERUS    . HYSTEROSCOPY WITH D & C N/A 02/28/2018   Procedure: DILATATION AND CURETTAGE Carl Best;  Surgeon: Malachy Mood, MD;  Location: [facility];  Service: Gynecology;  Laterality: N/A;  . POLYPECTOMY  08/24/2016   Procedure: POLYPECTOMY;  Surgeon: Lucilla Lame, MD;  Location: [place];  Service: Endoscopy;;    Gynecologic History:  No LMP recorded. Patient is postmenopausal. Last Pap: Results  were: 01/07/2020 NIL and HR HPV negative  Last mammogram: 01/06/2021 Results were: read pending  Obstetric History: G1P0010  Family History:  Family History  Problem Relation Age of Onset  . Breast cancer Maternal Grandmother     Social History:  Social History   Socioeconomic History  . Marital status: Married    Spouse name: Not on file  . Number of children: Not on file  . Years of education: Not on file  . Highest education level: Not on file  Occupational History  . Not on file  Tobacco Use  . Smoking status: Former Smoker    Packs/day: 0.50    Types: Cigarettes  . Smokeless tobacco: Never Used  Vaping Use  . Vaping Use: Never used  Substance and Sexual Activity  . Alcohol use: No  . Drug use: No  . Sexual activity: Yes    Birth control/protection: None  Other Topics Concern  . Not on file  Social History Narrative  . Not on file   Social Determinants of Health   Financial Resource Strain: Not on file  Food Insecurity: Not on file  Transportation Needs: Not on file  Physical Activity: Not on file  Stress: Not on file  Social Connections: Not on file  Intimate Partner Violence: Not on file    Allergies:  Allergies  Allergen Reactions  . Milk-Related Compounds     Dairy causes eczema flares and affects breathing (not shortness of breath)   . Penicillins Hives  Has patient had a PCN reaction causing immediate rash, facial/tongue/throat swelling, SOB or lightheadedness with hypotension: No Has patient had a PCN reaction causing severe rash involving mucus membranes or skin necrosis: Yes Has patient had a PCN reaction that required hospitalization: No Has patient had a PCN reaction occurring within the last 10 years: No If all of the above answers are "NO", then may proceed with Cephalosporin use.   . Sulfa Antibiotics Hives  . Latex Hives    Diaphragm, gloves  . Rosin [Pinus Strobus] Hives and Nausea Only    Eczema flare     Medications: Prior to  Admission medications   Medication Sig Start Date End Date Taking? Authorizing Provider  cetirizine (ZYRTEC) 10 MG tablet Take 10 mg by mouth daily.    [provider]  estradiol (ESTRACE VAGINAL) 0.1 MG/GM vaginal cream Place 1 g vaginally 3 (three) times a week. 05/14/20   Malachy Mood, MD  ferrous sulfate 325 (65 FE) MG tablet Take 325 mg by mouth twice daily 08/04/16   [provider]  hydrocortisone cream 1 % Apply 1 application topically as needed for itching.    [provider]  levothyroxine (SYNTHROID) 100 MCG tablet Take 1 tablet (100 mcg total) by mouth daily before breakfast. 05/03/20   Cletis Athens, MD    Physical Exam Vitals: Blood pressure (!) 144/82, height 5' 3.25" (1.607 m), weight 180 lb (81.6 kg).  General: NAD HEENT: normocephalic, anicteric Thyroid: no enlargement, no palpable nodules Pulmonary: No increased work of breathing, CTAB Cardiovascular: RRR, distal pulses 2+ Breast: Breast symmetrical, no tenderness, no palpable nodules or masses, no skin or nipple retraction present, no nipple discharge.  No axillary or supraclavicular lymphadenopathy. Abdomen: NABS, soft, non-tender, non-distended.  Umbilicus without lesions.  No hepatomegaly, splenomegaly or masses palpable. No evidence of hernia  Genitourinary:  External: Normal external female genitalia.  Normal urethral meatus, normal Bartholin's and Skene's glands.    Vagina: Normal vaginal mucosa, no evidence of prolapse.    Cervix: Grossly normal in appearance, no bleeding  Uterus: Non-enlarged, mobile, normal contour.  No CMT  Adnexa: ovaries non-enlarged, no adnexal masses  Rectal: deferred  Lymphatic: no evidence of inguinal lymphadenopathy Extremities: no edema, erythema, or tenderness Neurologic: Grossly intact Psychiatric: mood appropriate, affect full  Female chaperone present for pelvic and breast  portions of the physical exam     Assessment: 65 y.o. G1P0010 routine  annual exam  Plan: Problem List Items Addressed This Visit   None   Visit Diagnoses    Postoperative visit    -  Primary      1) Mammogram - recommend yearly screening mammogram.  Mammogram Is up to date  2) STI screening  was notoffered and therefore not obtained  3) ASCCP guidelines and rational discussed.  Patient opts for every 3 years screening interval  4) Osteoporosis  - per USPTF routine screening DEXA at age 45  5) Routine healthcare maintenance including cholesterol, diabetes screening discussed managed by PCP  6) Colonoscopy - UTD 08/24/2016 Dr. Allen Norris  7) Return in about 1 year (around 01/07/2022) for annual.    Malachy Mood, MD [facility], [facility] 01/07/2021, 8:15 AM

## 2021-04-17 ENCOUNTER — Other Ambulatory Visit: Payer: Self-pay | Admitting: Internal Medicine

## 2021-07-17 ENCOUNTER — Other Ambulatory Visit: Payer: Self-pay | Admitting: Obstetrics and Gynecology

## 2021-11-23 ENCOUNTER — Other Ambulatory Visit: Payer: Self-pay | Admitting: Internal Medicine

## 2021-11-23 DIAGNOSIS — Z1231 Encounter for screening mammogram for malignant neoplasm of breast: Secondary | ICD-10-CM

## 2022-01-10 ENCOUNTER — Other Ambulatory Visit: Payer: Self-pay

## 2022-01-10 ENCOUNTER — Ambulatory Visit
Admission: RE | Admit: 2022-01-10 | Discharge: 2022-01-10 | Disposition: A | Discharge status: Home / Self Care | Payer: BC Managed Care – PPO | Source: Ambulatory Visit | Attending: Internal Medicine | Admitting: Internal Medicine

## 2022-01-10 DIAGNOSIS — Z1231 Encounter for screening mammogram for malignant neoplasm of breast: Secondary | ICD-10-CM | POA: Insufficient documentation

## 2022-01-16 ENCOUNTER — Ambulatory Visit (INDEPENDENT_AMBULATORY_CARE_PROVIDER_SITE_OTHER): Payer: BC Managed Care – PPO | Admitting: Obstetrics and Gynecology

## 2022-01-16 ENCOUNTER — Encounter: Payer: Self-pay | Admitting: Obstetrics and Gynecology

## 2022-01-16 VITALS — BP 132/80 | Ht 63.0 in | Wt 184.0 lb

## 2022-01-16 DIAGNOSIS — Z1211 Encounter for screening for malignant neoplasm of colon: Secondary | ICD-10-CM | POA: Diagnosis not present

## 2022-01-16 DIAGNOSIS — Z1239 Encounter for other screening for malignant neoplasm of breast: Secondary | ICD-10-CM

## 2022-01-16 DIAGNOSIS — Z01419 Encounter for gynecological examination (general) (routine) without abnormal findings: Secondary | ICD-10-CM | POA: Diagnosis not present

## 2022-01-16 NOTE — Progress Notes (Signed)
? ? ?Gynecology Annual Exam  ?PCP: Cletis Athens, MD ? ?Chief Complaint:  ?Chief Complaint  ?Patient presents with  ? Annual Exam  ? ? ?History of Present Illness: Patient is a 66 y.o. G1P0010 presents for annual exam. The patient has no complaints today.  ? ?LMP: No LMP recorded. Patient is postmenopausal. ?She denies postmenopausal bleeding or spotting ? ?The patient is sexually active. She denies dyspareunia.  Postcoital Bleeding: no ? ? The patient does perform self breast exams.  There is no notable family history of breast or ovarian cancer in her family. ? ?The patient has regular exercise: walking, hiking, stretching ? ?The patient denies current symptoms of depression.  ? ?PHQ-9: 0 ?GAD-7: 0  ? ?Review of Systems: Review of Systems  ?Constitutional:  Negative for chills, fever, malaise/fatigue and weight loss.  ?HENT:  Negative for congestion, hearing loss and sinus pain.   ?Eyes:  Negative for blurred vision and double vision.  ?Respiratory:  Negative for cough, sputum production, shortness of breath and wheezing.   ?Cardiovascular:  Negative for chest pain, palpitations, orthopnea and leg swelling.  ?Gastrointestinal:  Negative for abdominal pain, constipation, diarrhea, nausea and vomiting.  ?Genitourinary:  Negative for dysuria, flank pain, frequency, hematuria and urgency.  ?Musculoskeletal:  Negative for back pain, falls and joint pain.  ?Skin:  Negative for itching and rash.  ?Neurological:  Negative for dizziness and headaches.  ?Psychiatric/Behavioral:  Negative for depression, substance abuse and suicidal ideas. The patient is not nervous/anxious.   ? ?Past Medical History:  ?Past Medical History:  ?Diagnosis Date  ? Anemia   ? Eczema   ? GERD (gastroesophageal reflux disease)   ? no meds  ? Hemorrhoids   ? History of hiatal hernia   ? Hypothyroidism   ? Migraines   ? food and stress induced  ? ? ?Past Surgical History:  ?Past Surgical History:  ?Procedure Laterality Date  ? COLONOSCOPY WITH  PROPOFOL N/A 08/24/2016  ? Procedure: COLONOSCOPY WITH PROPOFOL;  Surgeon: Lucilla Lame, MD;  Location: [place];  Service: Endoscopy;  Laterality: N/A;  Latex sensitivity  ? DILATION AND CURETTAGE OF UTERUS    ? HYSTEROSCOPY WITH D & C N/A 02/28/2018  ? Procedure: DILATATION AND CURETTAGE Carl Best;  Surgeon: Malachy Mood, MD;  Location: [facility];  Service: Gynecology;  Laterality: N/A;  ? POLYPECTOMY  08/24/2016  ? Procedure: POLYPECTOMY;  Surgeon: Lucilla Lame, MD;  Location: [place];  Service: Endoscopy;;  ? ? ?Gynecologic History:  ?No LMP recorded. Patient is postmenopausal. ?Last Pap: Results were: 2021 NIL   ?Last mammogram: 2023  Results were: BI-RAD I ? ?Obstetric History: G1P0010 ? ?Family History:  ?Family History  ?Problem Relation Age of Onset  ? Breast cancer Maternal Grandmother   ? ? ?Social History:  ?Social History  ? ?Socioeconomic History  ? Marital status: Married  ?  Spouse name: Not on file  ? Number of children: Not on file  ? Years of education: Not on file  ? Highest education level: Not on file  ?Occupational History  ? Not on file  ?Tobacco Use  ? Smoking status: Former  ?  Packs/day: 0.50  ?  Types: Cigarettes  ? Smokeless tobacco: Never  ?Vaping Use  ? Vaping Use: Never used  ?Substance and Sexual Activity  ? Alcohol use: No  ? Drug use: No  ? Sexual activity: Yes  ?  Birth control/protection: None  ?Other Topics Concern  ? Not on file  ?Social  History Narrative  ? Not on file  ? ?Social Determinants of Health  ? ?Financial Resource Strain: Not on file  ?Food Insecurity: Not on file  ?Transportation Needs: Not on file  ?Physical Activity: Not on file  ?Stress: Not on file  ?Social Connections: Not on file  ?Intimate Partner Violence: Not on file  ? ? ?Allergies:  ?Allergies  ?Allergen Reactions  ? Milk-Related Compounds   ?  Dairy causes eczema flares and affects breathing (not shortness of breath)   ? Penicillins Hives  ?  Has patient had a PCN  reaction causing immediate rash, facial/tongue/throat swelling, SOB or lightheadedness with hypotension: No ?Has patient had a PCN reaction causing severe rash involving mucus membranes or skin necrosis: Yes ?Has patient had a PCN reaction that required hospitalization: No ?Has patient had a PCN reaction occurring within the last 10 years: No ?If all of the above answers are "NO", then may proceed with Cephalosporin use. ?  ? Sulfa Antibiotics Hives  ? Latex Hives  ?  Diaphragm, gloves  ? Rosin [Pinus Strobus] Hives and Nausea Only  ?  Eczema flare   ? ? ?Medications: ?Prior to Admission medications   ?Medication Sig Start Date End Date Taking? Authorizing Provider  ?cetirizine (ZYRTEC) 10 MG tablet Take 10 mg by mouth daily.   Yes [provider]  ?estradiol (ESTRACE) 0.1 MG/GM vaginal cream PLACE 1 G VAGINALLY 3 (THREE) TIMES A WEEK. 07/20/21  Yes Malachy Mood, MD  ?ferrous sulfate 325 (65 FE) MG tablet Take 325 mg by mouth twice daily 08/04/16  Yes [provider]  ?hydrocortisone cream 1 % Apply 1 application topically as needed for itching.   Yes [provider]  ?levothyroxine (SYNTHROID) 100 MCG tablet TAKE 1 TABLET BY MOUTH DAILY BEFORE BREAKFAST. 04/19/21  Yes Cletis Athens, MD  ? ? ?Physical Exam ?Vitals: Blood pressure 132/80, height '5\' 3"'$  (1.6 m), weight 184 lb (83.5 kg). ? ?Physical Exam ?Constitutional:   ?   Appearance: She is well-developed.  ?Genitourinary:  ?   Genitourinary Comments: External: Normal appearing vulva. No lesions noted.  ? ?Bimanual examination: Uterus midline, non-tender, normal in size, shape and contour.  No CMT. No adnexal masses. No adnexal tenderness. Pelvis not fixed. ? ?Breast Exam: breast equal without skin changes, nipple discharge, breast lump or enlarged lymph nodes ?  ?HENT:  ?   Head: Normocephalic and atraumatic.  ?Neck:  ?   Thyroid: No thyromegaly.  ?Cardiovascular:  ?   Rate and Rhythm: Normal rate and regular rhythm.  ?   Heart sounds:  Normal heart sounds.  ?Pulmonary:  ?   Effort: Pulmonary effort is normal.  ?   Breath sounds: Normal breath sounds.  ?Abdominal:  ?   General: Bowel sounds are normal. There is no distension.  ?   Palpations: Abdomen is soft. There is no mass.  ?Musculoskeletal:  ?   Cervical back: Neck supple.  ?Neurological:  ?   Mental Status: She is alert and oriented to person, place, and time.  ?Skin: ?   General: Skin is warm and dry.  ?Psychiatric:     ?   Behavior: Behavior normal.     ?   Thought Content: Thought content normal.     ?   Judgment: Judgment normal.  ?Vitals reviewed.  ? ? ? ?Female chaperone present for pelvic and breast  portions of the physical exam ? ?Assessment: 66 y.o. G1P0010 routine annual exam ? ?Plan: ?Problem List Items Addressed This  Visit   ?None ?Visit Diagnoses   ? ? Encounter for annual routine gynecological examination    -  Primary  ? Encounter for gynecological examination without abnormal finding      ? Colon cancer screening      ? Relevant Orders  ? Ambulatory referral to Gastroenterology  ? Encounter for screening breast examination      ? ?  ? ? ?1) Mammogram - recommend yearly screening mammogram.  Mammogram Is up to date ? ?2) STI screening was offered and declined ? ?3) Pap smear due next year. ? ?4) Colonoscopy --last in 2017- 5 year follow up advised, discussed with patient. Referral placed ? ?5) Routine healthcare maintenance including cholesterol, diabetes screening discussed managed by PCP ? ?6) Osteoporosis screening - no increased risk factors, start at 65.  ? ?Adrian Prows MD, FACOG ?[facility], [facility] ?01/16/2022 ?9:15 AM ? ? ? ? ? ? ? ?

## 2022-01-16 NOTE — Patient Instructions (Signed)
Institute of Medicine Recommended Dietary Allowances for Calcium and Vitamin D  ?Age ?(yr) Calcium ?Recommended ?Dietary Allowance ?(mg/day) Vitamin D ?Recommended ?Dietary Allowance ?(international units/day)  ?9-18 1,300 600  ?19-50 1,000 600  ?51-70 1,200 600  ?71 and older 1,200 800  ?Data from [place]. Dietary reference intakes: calcium, vitamin D. [place], [place]: [employer]; 2011.  ? Exercising to Stay Healthy ?To become healthy and stay healthy, it is recommended that you do moderate-intensity and vigorous-intensity exercise. You can tell that you are exercising at a moderate intensity if your heart starts beating faster and you start breathing faster but can still hold a conversation. You can tell that you are exercising at a vigorous intensity if you are breathing much harder and faster and cannot hold a conversation while exercising. ?How can exercise benefit me? ?Exercising regularly is important. It has many health benefits, such as: ?Improving overall fitness, flexibility, and endurance. ?Increasing bone density. ?Helping with weight control. ?Decreasing body fat. ?Increasing muscle strength and endurance. ?Reducing stress and tension, anxiety, depression, or anger. ?Improving overall health. ?What guidelines should I follow while exercising? ?Before you start a new exercise program, talk with your health care provider. ?Do not exercise so much that you hurt yourself, feel dizzy, or get very short of breath. ?Wear comfortable clothes and wear shoes with good support. ?Drink plenty of water while you exercise to prevent dehydration or heat stroke. ?Work out until your breathing and your heartbeat get faster (moderate intensity). ?How often should I exercise? ?Choose an activity that you enjoy, and set realistic goals. Your health care provider can help you make an activity plan that is individually designed and works best for you. ?Exercise regularly as told by your health  care provider. This may include: ?Doing strength training two times a week, such as: ?Lifting weights. ?Using resistance bands. ?Push-ups. ?Sit-ups. ?Yoga. ?Doing a certain intensity of exercise for a given amount of time. Choose from these options: ?A total of 150 minutes of moderate-intensity exercise every week. ?A total of 75 minutes of vigorous-intensity exercise every week. ?A mix of moderate-intensity and vigorous-intensity exercise every week. ?Children, pregnant women, people who have not exercised regularly, people who are overweight, and older adults may need to talk with a health care provider about what activities are safe to perform. If you have a medical condition, be sure to talk with your health care provider before you start a new exercise program. ?What are some exercise ideas? ?Moderate-intensity exercise ideas include: ?Walking 1 mile (1.6 km) in about 15 minutes. ?Biking. ?Hiking. ?Golfing. ?Dancing. ?Water aerobics. ?Vigorous-intensity exercise ideas include: ?Walking 4.5 miles (7.2 km) or more in about 1 hour. ?Jogging or running 5 miles (8 km) in about 1 hour. ?Biking 10 miles (16.1 km) or more in about 1 hour. ?Lap swimming. ?Roller-skating or in-line skating. ?Cross-country skiing. ?Vigorous competitive sports, such as football, basketball, and soccer. ?Jumping rope. ?Aerobic dancing. ?What are some everyday activities that can help me get exercise? ?[place] work, such as: ?Pushing a Conservation officer, nature. ?Raking and bagging leaves. ?Washing your car. ?Pushing a stroller. ?Shoveling snow. ?Gardening. ?Washing windows or floors. ?How can I be more active in my day-to-day activities? ?Use stairs instead of an elevator. ?Take a walk during your lunch break. ?If you drive, park your car farther away from your work or school. ?If you take public transportation, get off one stop early and walk the rest of the way. ?Stand up or walk around during all of  your indoor phone calls. ?Get up, stretch, and walk  around every 30 minutes throughout the day. ?Enjoy exercise with a friend. Support to continue exercising will help you keep a regular routine of activity. ?Where to find more information ?You can find more information about exercising to stay healthy from: ?U.S. Department of Health and Human Services: [employer].at ?Centers for Disease Control and Prevention (CDC): http://www.wolf.info/ ?Summary ?Exercising regularly is important. It will improve your overall fitness, flexibility, and endurance. ?Regular exercise will also improve your overall health. It can help you control your weight, reduce stress, and improve your bone density. ?Do not exercise so much that you hurt yourself, feel dizzy, or get very short of breath. ?Before you start a new exercise program, talk with your health care provider. ?This information is not intended to replace advice given to you by your health care provider. Make sure you discuss any questions you have with your health care provider. ?Document Revised: 01/28/2021 Document Reviewed: 01/28/2021 ?Elsevier Patient Education ? [place]. ?Budget-Friendly Healthy Eating ?There are many ways to save money at the grocery store and continue to eat healthy. You can be successful if you: ?Plan meals according to your budget. ?Make a grocery list and only purchase food according to your grocery list. ?Prepare food yourself at home. ?What are tips for following this plan? ?Reading food labels ?Compare food labels between brand name foods and the store brand. Often the nutritional value is the same, but the store brand is lower cost. ?Look for products that do not have added sugar, fat, or salt (sodium). These often cost the same but are healthier for you. Products may be labeled as: ?Sugar-free. ?Nonfat. ?Low-fat. ?Sodium-free. ?Low-sodium. ?Look for lean ground beef labeled as at least 92% lean and 8% fat. ?Shopping ? ?Buy only the items on your grocery list and go only to the areas of the store  that have the items on your list. ?Use coupons only for foods and brands you normally buy. Avoid buying items you wouldn't normally buy simply because they are on sale. ?Check online and in newspapers for weekly deals. ?Buy healthy items from the bulk bins when available, such as herbs, spices, flour, pasta, nuts, and dried fruit. ?Buy fruits and vegetables that are in season. Prices are usually lower on in-season produce. ?Look at the unit price on the price tag. Use it to compare different brands and sizes to find out which item is the best deal. ?Choose healthy items that are often low-cost, such as carrots, potatoes, apples, bananas, and oranges. Dried or canned beans are a low-cost protein source. ?Buy in bulk and freeze extra food. Items you can buy in bulk include meats, fish, poultry, frozen fruits, and frozen vegetables. ?Avoid buying "ready-to-eat" foods, such as pre-cut fruits and vegetables and pre-made salads. ?If possible, shop around to discover where you can find the best prices. Consider other retailers such as dollar stores, larger [employer], local fruit and vegetable stands, and farmers markets. ?Do not shop when you are hungry. If you shop while hungry, it may be hard to stick to your list and budget. ?Resist impulse buying. Use your grocery list as your official plan for the week. ?Buy a variety of vegetables and fruits by purchasing fresh, frozen, and canned items. ?Look at the top and bottom shelves for deals. Foods at eye level (eye level of an adult or child) are usually more expensive. ?Be efficient with your time when shopping. The more time you  spend at the store, the more money you are likely to spend. ?To save money when choosing more expensive foods like meats and dairy: ?Choose cheaper cuts of meat, such as bone-in chicken thighs and drumsticks instead of skinless and boneless chicken. When you are ready to prepare the chicken, you can remove the skin yourself to make it  healthier. ?Choose lean meats like chicken or [place] instead of beef. ?Choose canned seafood, such as tuna, salmon, or sardines. ?Buy eggs as a low-cost source of protein. ?Buy dried beans and peas, such as le

## 2022-01-17 ENCOUNTER — Telehealth: Payer: Self-pay

## 2022-01-17 NOTE — Telephone Encounter (Signed)
CALLED PATIENT NO ANSWER LEFT VOICEMAIL FOR A CALL BACK ? ?

## 2022-01-18 ENCOUNTER — Telehealth: Payer: Self-pay

## 2022-01-18 NOTE — Telephone Encounter (Signed)
CALLED PATIENT NO ANSWER LEFT VOICEMAIL FOR A CALL BACK ? ?

## 2022-01-31 NOTE — Telephone Encounter (Signed)
Patient calling back to schedule colonoscopy. Requesting a call back. ?

## 2022-02-01 ENCOUNTER — Telehealth: Payer: Self-pay

## 2022-02-01 NOTE — Telephone Encounter (Signed)
CALLED PATIENT NO ANSWER LEFT VOICEMAIL FOR A CALL BACK ? ?

## 2022-04-28 ENCOUNTER — Other Ambulatory Visit: Payer: Self-pay | Admitting: Internal Medicine

## 2022-04-30 ENCOUNTER — Other Ambulatory Visit: Payer: Self-pay | Admitting: Internal Medicine

## 2022-05-01 ENCOUNTER — Other Ambulatory Visit: Payer: Self-pay | Admitting: Internal Medicine

## 2022-05-03 ENCOUNTER — Other Ambulatory Visit: Payer: Self-pay | Admitting: Internal Medicine

## 2022-05-04 ENCOUNTER — Ambulatory Visit (INDEPENDENT_AMBULATORY_CARE_PROVIDER_SITE_OTHER): Payer: BC Managed Care – PPO | Admitting: *Deleted

## 2022-05-04 DIAGNOSIS — E039 Hypothyroidism, unspecified: Secondary | ICD-10-CM

## 2022-05-04 DIAGNOSIS — Z0000 Encounter for general adult medical examination without abnormal findings: Secondary | ICD-10-CM | POA: Diagnosis not present

## 2022-05-05 LAB — COMPLETE METABOLIC PANEL WITH GFR
AG Ratio: 1.9 (calc) (ref 1.0–2.5)
ALT: 22 U/L (ref 6–29)
AST: 17 U/L (ref 10–35)
Albumin: 4.4 g/dL (ref 3.6–5.1)
Alkaline phosphatase (APISO): 63 U/L (ref 37–153)
BUN: 21 mg/dL (ref 7–25)
CO2: 24 mmol/L (ref 20–32)
Calcium: 9.3 mg/dL (ref 8.6–10.4)
Chloride: 104 mmol/L (ref 98–110)
Creat: 0.74 mg/dL (ref 0.50–1.05)
Globulin: 2.3 g/dL (calc) (ref 1.9–3.7)
Glucose, Bld: 96 mg/dL (ref 65–99)
Potassium: 4.1 mmol/L (ref 3.5–5.3)
Sodium: 139 mmol/L (ref 135–146)
Total Bilirubin: 0.5 mg/dL (ref 0.2–1.2)
Total Protein: 6.7 g/dL (ref 6.1–8.1)
eGFR: 90 mL/min/{1.73_m2} (ref >=60)

## 2022-05-05 LAB — CBC WITH DIFFERENTIAL/PLATELET
Absolute Monocytes: 625 cells/uL (ref 200–950)
Basophils Absolute: 58 cells/uL (ref 0–200)
Basophils Relative: 1.1 %
Eosinophils Absolute: 419 cells/uL (ref 15–500)
Eosinophils Relative: 7.9 %
HCT: 46.3 % — ABNORMAL HIGH (ref 35.0–45.0)
Hemoglobin: 15.6 g/dL — ABNORMAL HIGH (ref 11.7–15.5)
Lymphs Abs: 2046 cells/uL (ref 850–3900)
MCH: 30.7 pg (ref 27.0–33.0)
MCHC: 33.7 g/dL (ref 32.0–36.0)
MCV: 91.1 fL (ref 80.0–100.0)
MPV: 10.7 fL (ref 7.5–12.5)
Monocytes Relative: 11.8 %
Neutro Abs: 2152 cells/uL (ref 1500–7800)
Neutrophils Relative %: 40.6 %
Platelets: 207 10*3/uL (ref 140–400)
RBC: 5.08 10*6/uL (ref 3.80–5.10)
RDW: 12 % (ref 11.0–15.0)
Total Lymphocyte: 38.6 %
WBC: 5.3 10*3/uL (ref 3.8–10.8)

## 2022-05-05 LAB — LIPID PANEL
Cholesterol: 153 mg/dL (ref <200)
HDL: 37 mg/dL — ABNORMAL LOW (ref >=50)
LDL Cholesterol (Calc): 96 mg/dL (calc)
Non-HDL Cholesterol (Calc): 116 mg/dL (calc) (ref <130)
Total CHOL/HDL Ratio: 4.1 (calc) (ref <5.0)
Triglycerides: 107 mg/dL (ref <150)

## 2022-05-05 LAB — TSH: TSH: 0.39 mIU/L — ABNORMAL LOW (ref 0.40–4.50)

## 2022-05-08 ENCOUNTER — Encounter: Payer: Self-pay | Admitting: Internal Medicine

## 2022-05-08 ENCOUNTER — Ambulatory Visit: Payer: BC Managed Care – PPO | Admitting: Internal Medicine

## 2022-05-08 VITALS — BP 127/79 | HR 73 | Ht 63.0 in | Wt 160.0 lb

## 2022-05-08 DIAGNOSIS — N950 Postmenopausal bleeding: Secondary | ICD-10-CM | POA: Diagnosis not present

## 2022-05-08 DIAGNOSIS — E039 Hypothyroidism, unspecified: Secondary | ICD-10-CM

## 2022-05-08 DIAGNOSIS — D124 Benign neoplasm of descending colon: Secondary | ICD-10-CM

## 2022-05-08 DIAGNOSIS — G43709 Chronic migraine without aura, not intractable, without status migrainosus: Secondary | ICD-10-CM

## 2022-05-08 NOTE — Assessment & Plan Note (Signed)
TSH is within normal range

## 2022-05-08 NOTE — Progress Notes (Signed)
Established Patient Office Visit  Subjective:  Patient ID: Barbara Walter, female    DOB: May 30, 1956  Age: 66 y.o. MRN: [id number]  CC:  Chief Complaint  Patient presents with   Lab Results    HPI  Barbara Walter presents for check up  Past Medical History:  Diagnosis Date   Anemia    Eczema    GERD (gastroesophageal reflux disease)    no meds   Hemorrhoids    History of hiatal hernia    Hypothyroidism    Migraines    food and stress induced    Past Surgical History:  Procedure Laterality Date   COLONOSCOPY WITH PROPOFOL N/A 08/24/2016   Procedure: COLONOSCOPY WITH PROPOFOL;  Surgeon: Lucilla Lame, MD;  Location: [place];  Service: Endoscopy;  Laterality: N/A;  Latex sensitivity   DILATION AND CURETTAGE OF UTERUS     HYSTEROSCOPY WITH D & C N/A 02/28/2018   Procedure: DILATATION AND CURETTAGE Carl Best;  Surgeon: Malachy Mood, MD;  Location: [facility];  Service: Gynecology;  Laterality: N/A;   POLYPECTOMY  08/24/2016   Procedure: POLYPECTOMY;  Surgeon: Lucilla Lame, MD;  Location: [place];  Service: Endoscopy;;    Family History  Problem Relation Age of Onset   Breast cancer Maternal Grandmother     Social History   Socioeconomic History   Marital status: Married    Spouse name: Not on file   Number of children: Not on file   Years of education: Not on file   Highest education level: Not on file  Occupational History   Not on file  Tobacco Use   Smoking status: Former    Packs/day: 0.50    Types: Cigarettes   Smokeless tobacco: Never  Vaping Use   Vaping Use: Never used  Substance and Sexual Activity   Alcohol use: No   Drug use: No   Sexual activity: Yes    Birth control/protection: None  Other Topics Concern   Not on file  Social History Narrative   Not on file   Social Determinants of Health   Financial Resource Strain: Not on file  Food Insecurity: Not on file  Transportation Needs: Not on  file  Physical Activity: Not on file  Stress: Not on file  Social Connections: Not on file  Intimate Partner Violence: Not on file     Current Outpatient Medications:    cetirizine (ZYRTEC) 10 MG tablet, Take 10 mg by mouth daily., Disp: , Rfl:    ferrous sulfate 325 (65 FE) MG tablet, Take 325 mg by mouth twice daily, Disp: , Rfl: 6   hydrocortisone cream 1 %, Apply 1 application topically as needed for itching., Disp: , Rfl:    levothyroxine (SYNTHROID) 100 MCG tablet, TAKE 1 TABLET BY MOUTH EVERY DAY BEFORE BREAKFAST, Disp: 90 tablet, Rfl: 3   Allergies  Allergen Reactions   Milk-Related Compounds     Dairy causes eczema flares and affects breathing (not shortness of breath)    Penicillins Hives    Has patient had a PCN reaction causing immediate rash, facial/tongue/throat swelling, SOB or lightheadedness with hypotension: No Has patient had a PCN reaction causing severe rash involving mucus membranes or skin necrosis: Yes Has patient had a PCN reaction that required hospitalization: No Has patient had a PCN reaction occurring within the last 10 years: No If all of the above answers are "NO", then may proceed with Cephalosporin use.    Sulfa Antibiotics Hives  Latex Hives    Diaphragm, gloves   Rosin [Pinus Strobus] Hives and Nausea Only    Eczema flare     ROS Review of Systems  Constitutional: Negative.   HENT: Negative.    Eyes: Negative.   Respiratory: Negative.    Cardiovascular: Negative.   Gastrointestinal: Negative.   Endocrine: Negative.   Genitourinary: Negative.   Musculoskeletal: Negative.   Skin: Negative.   Allergic/Immunologic: Negative.   Neurological: Negative.   Hematological: Negative.   Psychiatric/Behavioral: Negative.    All other systems reviewed and are negative.     Objective:    Physical Exam Vitals reviewed.  Constitutional:      Appearance: Normal appearance.  HENT:     Mouth/Throat:     Mouth: Mucous membranes are moist.   Eyes:     Pupils: Pupils are equal, round, and reactive to light.  Neck:     Vascular: No carotid bruit.  Cardiovascular:     Rate and Rhythm: Normal rate and regular rhythm.     Pulses: Normal pulses.     Heart sounds: Normal heart sounds.  Pulmonary:     Effort: Pulmonary effort is normal.     Breath sounds: Normal breath sounds.  Abdominal:     General: Bowel sounds are normal.     Palpations: Abdomen is soft. There is no hepatomegaly, splenomegaly or mass.     Tenderness: There is no abdominal tenderness.     Hernia: No hernia is present.  Musculoskeletal:        General: No tenderness.     Cervical back: Neck supple.     Right lower leg: No edema.     Left lower leg: No edema.  Skin:    Findings: No rash.  Neurological:     Mental Status: She is alert and oriented to person, place, and time.     Motor: No weakness.  Psychiatric:        Mood and Affect: Mood and affect normal.        Behavior: Behavior normal.     BP 127/79   Pulse 73   Ht '5\' 3"'  (1.6 m)   Wt 160 lb (72.6 kg)   BMI 28.34 kg/m  Wt Readings from Last 3 Encounters:  05/08/22 160 lb (72.6 kg)  01/16/22 184 lb (83.5 kg)  01/07/21 180 lb (81.6 kg)     Health Maintenance Due  Topic Date Due   HIV Screening  Never done   Hepatitis C Screening  Never done   TETANUS/TDAP  Never done   Zoster Vaccines- Shingrix (1 of 2) Never done   COVID-19 Vaccine (2 - Moderna series) 10/04/2020   COLONOSCOPY (Pts 45-12yr Insurance coverage will need to be confirmed)  08/24/2021    There are no preventive care reminders to display for this patient.  Lab Results  Component Value Date   TSH 0.39 (L) 05/04/2022   Lab Results  Component Value Date   WBC 5.3 05/04/2022   HGB 15.6 (H) 05/04/2022   HCT 46.3 (H) 05/04/2022   MCV 91.1 05/04/2022   PLT 207 05/04/2022   Lab Results  Component Value Date   NA 139 05/04/2022   K 4.1 05/04/2022   CO2 24 05/04/2022   GLUCOSE 96 05/04/2022   BUN 21 05/04/2022    CREATININE 0.74 05/04/2022   BILITOT 0.5 05/04/2022   AST 17 05/04/2022   ALT 22 05/04/2022   PROT 6.7 05/04/2022   CALCIUM 9.3 05/04/2022   EGFR  90 05/04/2022   Lab Results  Component Value Date   CHOL 153 05/04/2022   Lab Results  Component Value Date   HDL 37 (L) 05/04/2022   Lab Results  Component Value Date   LDLCALC 96 05/04/2022   Lab Results  Component Value Date   TRIG 107 05/04/2022   Lab Results  Component Value Date   CHOLHDL 4.1 05/04/2022   No results found for: "HGBA1C"    Assessment & Plan:   Problem List Items Addressed This Visit       Cardiovascular and Mediastinum   Migraines - Primary    Stable at the present time        Digestive   Benign neoplasm of descending colon    Refer to GI        Endocrine   Hypothyroidism    TSH is within normal range        Other   Postmenopausal bleeding    No complaints       No orders of the defined types were placed in this encounter.   Follow-up: No follow-ups on file.    Cletis Athens, MD

## 2022-05-08 NOTE — Assessment & Plan Note (Signed)
No complaints

## 2022-05-08 NOTE — Assessment & Plan Note (Signed)
Refer to GI 

## 2022-05-08 NOTE — Assessment & Plan Note (Signed)
Stable at the present time. 

## 2022-10-29 IMAGING — MG MM DIGITAL SCREENING BILAT W/ TOMO AND CAD
8 series · 8 of 24 positions shown · non-contrast
Comparison: Previous exam(s).

CLINICAL DATA: Screening.

EXAM:
DIGITAL SCREENING BILATERAL MAMMOGRAM WITH TOMOSYNTHESIS AND CAD
TECHNIQUE: Bilateral screening digital craniocaudal and mediolateral oblique
mammograms were obtained. Bilateral screening digital breast
tomosynthesis was performed. The images were evaluated with
computer-aided detection.

[L CC synth-2D]
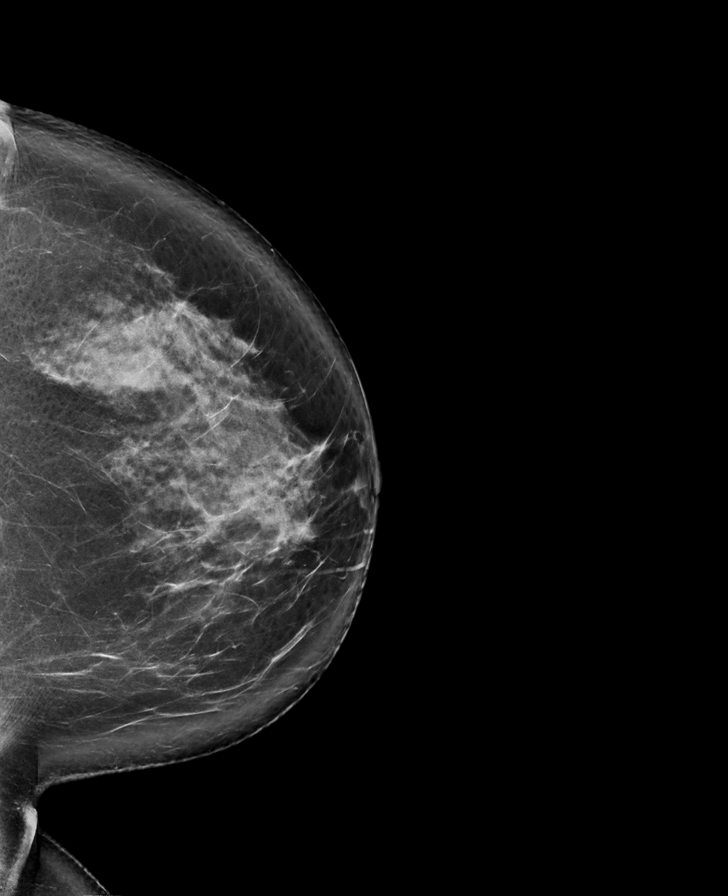

[R CC synth-2D]
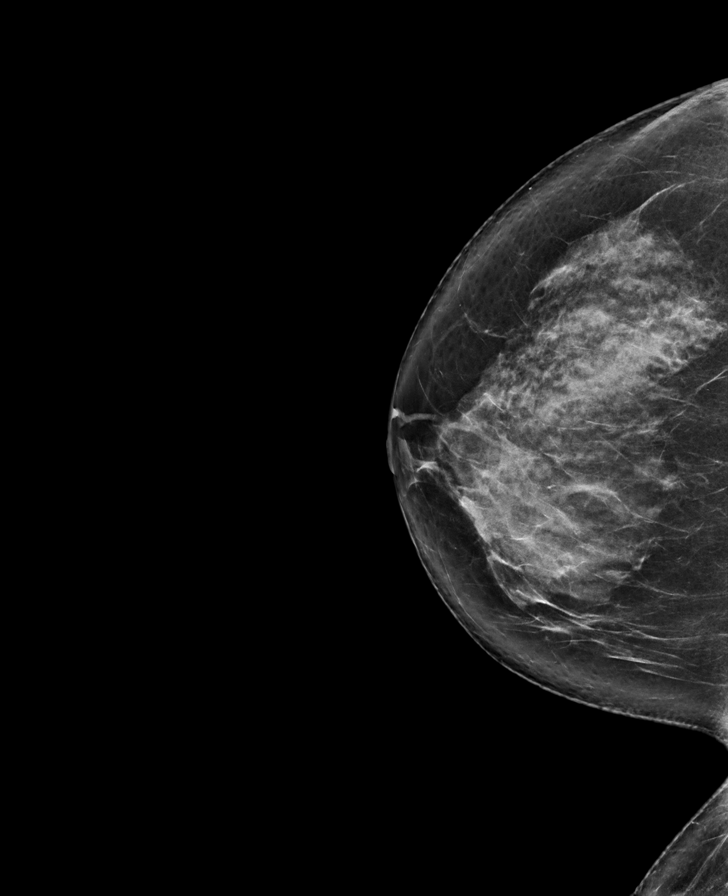

[R MLO synth-2D]
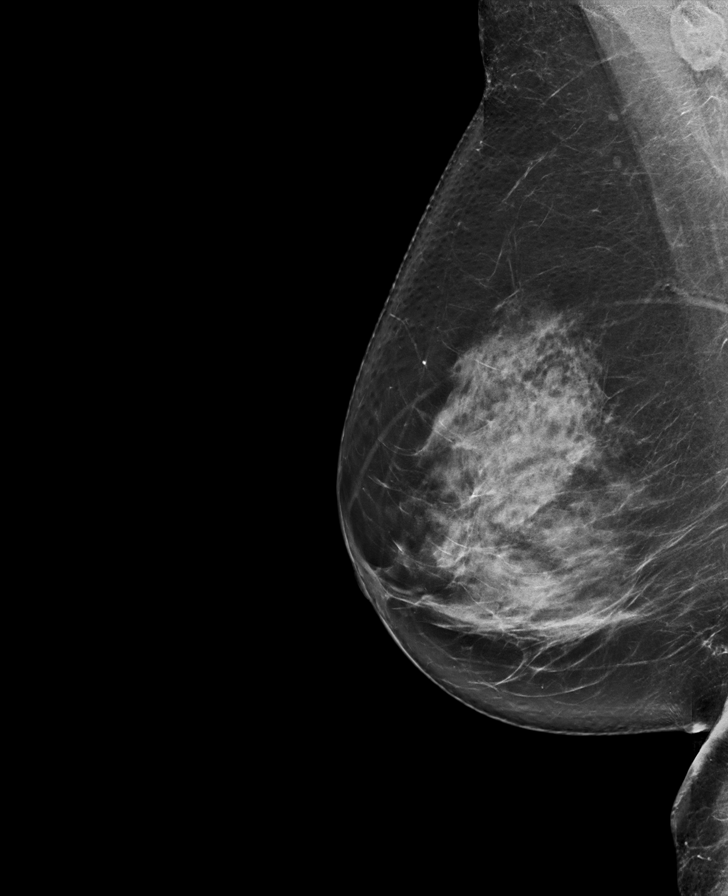

[L MLO synth-2D]
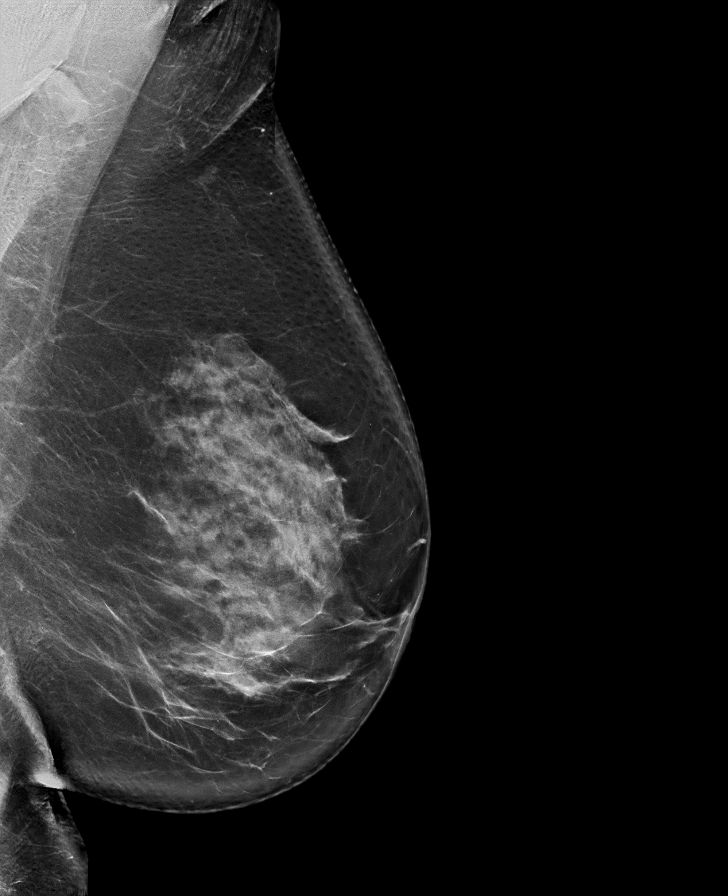

[L MLO tomo · tomo slice 51/102.0]
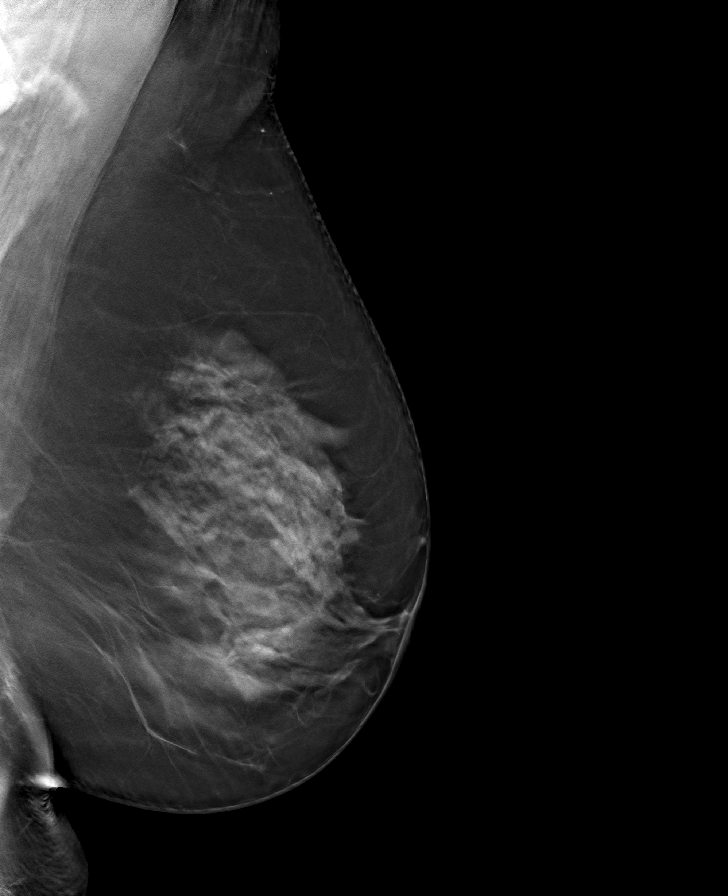

[R CC tomo · tomo slice 44/87.0]
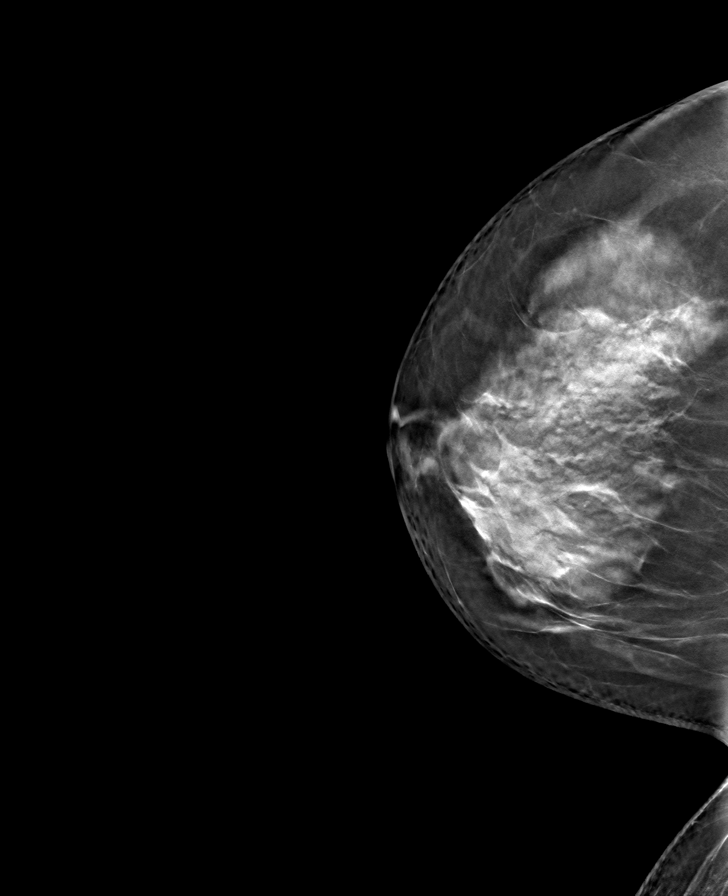

[R MLO tomo · tomo slice 45/89.0]
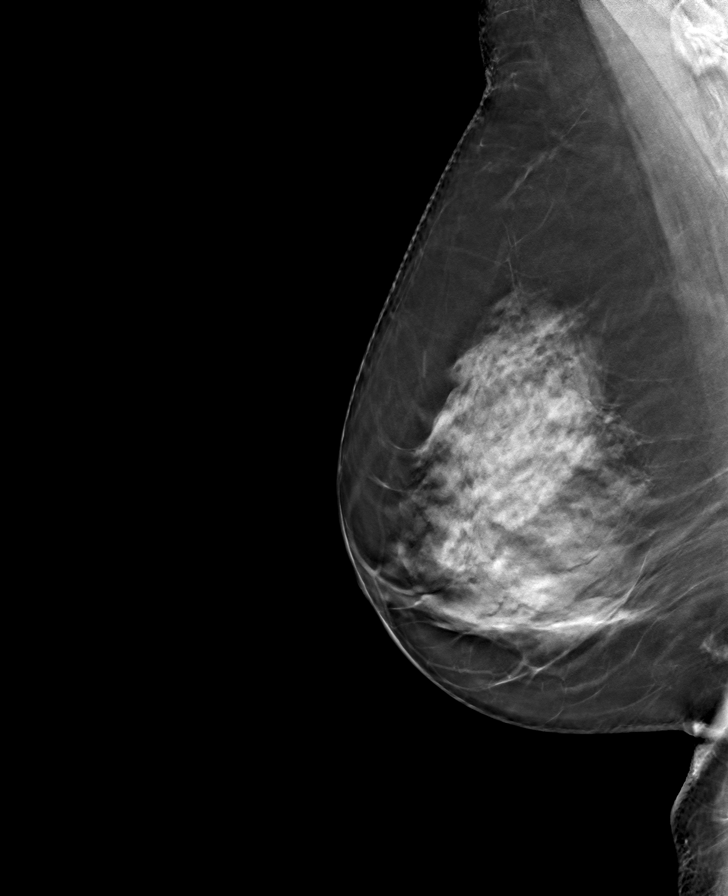

[L CC tomo · tomo slice 51/100.0]
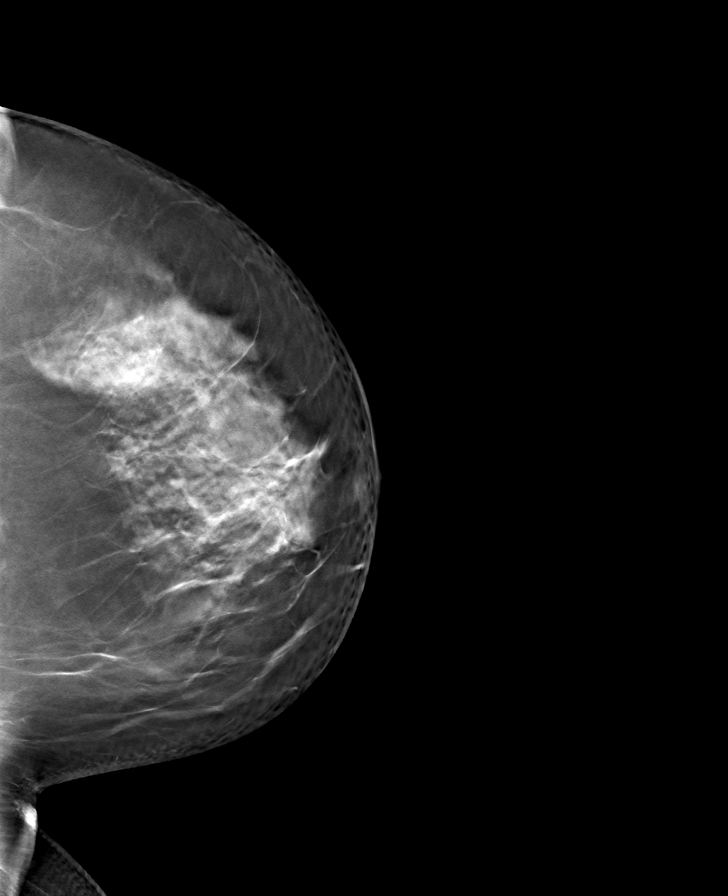

[8 of 24 positions shown; findings below may reference images not displayed]

ACR Breast Density Category d: The breast tissue is extremely dense,
which lowers the sensitivity of mammography
FINDINGS: There are no findings suspicious for malignancy.
IMPRESSION: No mammographic evidence of malignancy. A result letter of this
screening mammogram will be mailed directly to the patient.

RECOMMENDATION:
Screening mammogram in one year. (Code:TA-V-WV9)

BI-RADS CATEGORY  1: Negative.

## 2022-11-07 ENCOUNTER — Ambulatory Visit: Payer: BC Managed Care – PPO | Admitting: Internal Medicine

## 2022-11-27 ENCOUNTER — Encounter: Payer: Self-pay | Admitting: Internal Medicine

## 2023-02-14 ENCOUNTER — Encounter: Payer: Self-pay | Admitting: Internal Medicine

## 2023-02-14 ENCOUNTER — Ambulatory Visit: Payer: BC Managed Care – PPO | Admitting: Internal Medicine

## 2023-02-14 VITALS — BP 120/82 | HR 88 | Temp 97.3°F | Resp 16 | Ht 63.25 in | Wt 171.4 lb

## 2023-02-14 DIAGNOSIS — L209 Atopic dermatitis, unspecified: Secondary | ICD-10-CM | POA: Diagnosis not present

## 2023-02-14 DIAGNOSIS — Z1211 Encounter for screening for malignant neoplasm of colon: Secondary | ICD-10-CM

## 2023-02-14 DIAGNOSIS — Z1231 Encounter for screening mammogram for malignant neoplasm of breast: Secondary | ICD-10-CM

## 2023-02-14 DIAGNOSIS — R052 Subacute cough: Secondary | ICD-10-CM

## 2023-02-14 DIAGNOSIS — E039 Hypothyroidism, unspecified: Secondary | ICD-10-CM | POA: Diagnosis not present

## 2023-02-14 DIAGNOSIS — Z9109 Other allergy status, other than to drugs and biological substances: Secondary | ICD-10-CM | POA: Diagnosis not present

## 2023-02-14 DIAGNOSIS — Z13820 Encounter for screening for osteoporosis: Secondary | ICD-10-CM

## 2023-02-14 DIAGNOSIS — Z1159 Encounter for screening for other viral diseases: Secondary | ICD-10-CM

## 2023-02-14 DIAGNOSIS — Z23 Encounter for immunization: Secondary | ICD-10-CM

## 2023-02-14 DIAGNOSIS — Z114 Encounter for screening for human immunodeficiency virus [HIV]: Secondary | ICD-10-CM

## 2023-02-14 MED ORDER — ALBUTEROL SULFATE HFA 108 (90 BASE) MCG/ACT IN AERS
2.0000 | INHALATION_SPRAY | Freq: Four times a day (QID) | RESPIRATORY_TRACT | 2 refills | Status: DC | PRN
Start: 2023-02-14 — End: 2024-03-21

## 2023-02-14 NOTE — Progress Notes (Signed)
New Patient Office Visit  Subjective    Patient ID: Barbara Walter, female    DOB: 09-05-1956  Age: 67 y.o. MRN: [id number]  CC:  Chief Complaint  Patient presents with   Establish Care   Cough    X2 weeks   Medication Refill    HPI Barbara Walter presents to establish care. Would like to discuss cough that's been ongoing for 2 weeks.  URI Compliant:  -Fever: no -Cough: yes, dry  -Shortness of breath: no -Wheezing: no -Chest congestion: yes -Nasal congestion: no -Runny nose: yes -Post nasal drip: yes -Treatments attempted: cough syrup   Hypothyroidism: -Medications: Levothyroxine 100 mcg -Patient is compliant with the above medication (s) at the above dose and reports no medication side effects.  -Denies weight changes, cold./heat intolerance, skin changes, anxiety/palpitations  -Last TSH: 0.39 7/23  Seasonal Allergies: -Currently on Zyrtec 10 mg daily  Atopic Dermatitis: -Uses hydrocortisone cream 1% as needed  Health Maintenance: -Blood work due -Mammogram due -DEXA due -Colon cancer screening: colonoscopy 08/2016, repeat in 5 years, due.  -Prevnar 20 due   Outpatient Encounter Medications as of 02/14/2023  Medication Sig   cetirizine (ZYRTEC) 10 MG tablet Take 10 mg by mouth daily.   hydrocortisone cream 1 % Apply 1 application topically as needed for itching.   levothyroxine (SYNTHROID) 100 MCG tablet TAKE 1 TABLET BY MOUTH EVERY DAY BEFORE BREAKFAST   [DISCONTINUED] ferrous sulfate 325 (65 FE) MG tablet Take 325 mg by mouth twice daily   No facility-administered encounter medications on file as of 02/14/2023.    Past Medical History:  Diagnosis Date   Anemia    Eczema    GERD (gastroesophageal reflux disease)    no meds   Hemorrhoids    History of hiatal hernia    Hypothyroidism    Migraines    food and stress induced    Past Surgical History:  Procedure Laterality Date   COLONOSCOPY WITH PROPOFOL N/A 08/24/2016   Procedure:  COLONOSCOPY WITH PROPOFOL;  Surgeon: [name], MD;  [facility];  Service: Endoscopy;  Laterality: N/A;  Latex sensitivity   DILATION AND CURETTAGE OF UTERUS     HYSTEROSCOPY WITH D & C N/A 02/28/2018   Procedure: DILATATION AND CURETTAGE [name];  Surgeon: Vena Austria, MD;  Location: [facility];  Service: Gynecology;  Laterality: N/A;   POLYPECTOMY  08/24/2016   Procedure: POLYPECTOMY;  Surgeon: [name], MD;  Location[facility];  Service: Endoscopy;;    Family History  Problem Relation Age of Onset   Hypertension Mother    Breast cancer Maternal Grandmother     Social History   Socioeconomic History   Marital status: Married    Spouse name: Not on file   Number of children: Not on file   Years of education: Not on file   Highest education level: Not on file  Occupational History   Not on file  Tobacco Use   Smoking status: Former    Packs/day: .5    Types: Cigarettes   Smokeless tobacco: Never  Vaping Use   Vaping Use: Never used  Substance and Sexual Activity   Alcohol use: No   Drug use: No   Sexual activity: Yes    Birth control/protection: None  Other Topics Concern   Not on file  Social History Narrative   Not on file   Social Determinants of Health   Financial Resource Strain: Not on file  Food Insecurity: Not  on file  Transportation Needs: Not on file  Physical Activity: Not on file  Stress: Not on file  Social Connections: Not on file  Intimate Partner Violence: Not on file    Review of Systems  Constitutional:  Negative for chills and fever.  HENT:  Positive for congestion. Negative for sinus pain and sore throat.   Respiratory:  Positive for cough and wheezing. Negative for sputum production and shortness of breath.   Cardiovascular:  Negative for chest pain.        Objective    BP 120/82   Pulse 88   Temp (!) 97.3 F (36.3 C)   Resp 16   Ht 5' 3.25" (1.607 m)   Wt 171 lb 6.4 oz  (77.7 kg)   SpO2 97%   BMI 30.12 kg/m   Physical Exam Constitutional:      Appearance: Normal appearance.  HENT:     Head: Normocephalic and atraumatic.     Mouth/Throat:     Mouth: Mucous membranes are moist.     Comments: Mild PND Eyes:     Conjunctiva/sclera: Conjunctivae normal.  Cardiovascular:     Rate and Rhythm: Normal rate and regular rhythm.  Pulmonary:     Effort: Pulmonary effort is normal.     Breath sounds: Wheezing present. No rhonchi or rales.  Skin:    General: Skin is warm and dry.  Neurological:     General: No focal deficit present.     Mental Status: She is alert. Mental status is at baseline.  Psychiatric:        Mood and Affect: Mood normal.        Behavior: Behavior normal.     Last CBC Lab Results  Component Value Date   WBC 5.3 05/04/2022   HGB 15.6 (H) 05/04/2022   HCT 46.3 (H) 05/04/2022   MCV 91.1 05/04/2022   MCH 30.7 05/04/2022   RDW 12.0 05/04/2022   PLT 207 05/04/2022   Last metabolic panel Lab Results  Component Value Date   GLUCOSE 96 05/04/2022   NA 139 05/04/2022   K 4.1 05/04/2022   CL 104 05/04/2022   CO2 24 05/04/2022   BUN 21 05/04/2022   CREATININE 0.74 05/04/2022   EGFR 90 05/04/2022   CALCIUM 9.3 05/04/2022   PROT 6.7 05/04/2022   BILITOT 0.5 05/04/2022   AST 17 05/04/2022   ALT 22 05/04/2022   Last lipids Lab Results  Component Value Date   CHOL 153 05/04/2022   HDL 37 (L) 05/04/2022   LDLCALC 96 05/04/2022   TRIG 107 05/04/2022   CHOLHDL 4.1 05/04/2022   Last hemoglobin A1c No results found for: "HGBA1C" Last thyroid functions Lab Results  Component Value Date   TSH 0.39 (L) 05/04/2022   T3TOTAL 95 04/22/2020   T4TOTAL 9.3 04/22/2020   Last vitamin D No results found for: "25OHVITD2", "25OHVITD3", "VD25OH" Last vitamin B12 and Folate No results found for: "VITAMINB12", "FOLATE"      Assessment & Plan:   1. Hypothyroidism, unspecified type: Currently on Levothyroxine 100 mcg daily, will  recheck labs today to confirm correct dose.   - Thyroid Panel With TSH  2. Environmental allergies: Currently taking Zyrtec 10 mg daily.   3. Atopic dermatitis, unspecified type: Stable, uses low potency steroid cream as needed.  4. Subacute cough: Most likely allergic in nature, could be an asthmatic component. Treat with Albuterol as needed.   - albuterol (VENTOLIN HFA) 108 (90 Base) MCG/ACT inhaler; Inhale 2  puffs into the lungs every 6 (six) hours as needed for wheezing or shortness of breath.  Dispense: 8 g; Refill: 2  5. Encounter for hepatitis C screening test for low risk patient/Screening for HIV without presence of risk factors: Screening labs due.  - Hepatitis C Antibody  6. Encounter for screening mammogram for malignant neoplasm of breast/Screening for osteoporosis: Mammogram and bone scan ordered.  - MM 3D SCREENING MAMMOGRAM BILATERAL BREAST; Future - DG Bone Density; Future  7. Colon cancer screening: Colonoscopy due, referral placed.   - Ambulatory referral to Gastroenterology  8. Vaccine for streptococcus pneumoniae and influenza: Prevnar 20 administered.   - Pneumococcal conjugate vaccine 20-valent (Prevnar 20)   Return in about 3 months (around 05/17/2023) for CPE.   Margarita Mail, DO

## 2023-02-14 NOTE — Patient Instructions (Addendum)
It was great seeing you today!  Plan discussed at today's visit: -Blood work ordered today, results will be uploaded to MyChart. Will call you to up date thyroid medication dose -Prevnar 20 administered today for pneumonia  -Referral placed to GI - Call to schedule colonoscopy at [facility] [phone] -Please call number on card to schedule mammogram and bone scan -Albuterol inhaler sent to use as needed for wheezing/shortness of breath  Follow up in: 3 months for physical - please prepare for fasting labs  Take care and let us know if you have any questions or concerns prior to your next visit.  Dr. Caralee Ates

## 2023-02-15 LAB — THYROID PANEL WITH TSH
Free Thyroxine Index: 2.9 (ref 1.4–3.8)
T3 Uptake: 31 % (ref 22–35)
T4, Total: 9.3 ug/dL (ref 5.1–11.9)
TSH: 1.15 mIU/L (ref 0.40–4.50)

## 2023-02-15 LAB — HIV ANTIBODY (ROUTINE TESTING W REFLEX): HIV 1&2 Ab, 4th Generation: NONREACTIVE

## 2023-02-15 LAB — HEPATITIS C ANTIBODY: Hepatitis C Ab: NONREACTIVE

## 2023-02-15 MED ORDER — LEVOTHYROXINE SODIUM 100 MCG PO TABS: ORAL_TABLET | ORAL | 3 refills | Status: DC

## 2023-02-15 NOTE — Addendum Note (Signed)
Addended by: Margarita Mail on: 02/15/2023 08:39 PM   Modules accepted: Orders

## 2023-02-21 ENCOUNTER — Ambulatory Visit: Payer: BC Managed Care – PPO | Admitting: Podiatry

## 2023-02-21 ENCOUNTER — Encounter: Payer: Self-pay | Admitting: Podiatry

## 2023-02-21 ENCOUNTER — Ambulatory Visit (INDEPENDENT_AMBULATORY_CARE_PROVIDER_SITE_OTHER): Payer: BC Managed Care – PPO

## 2023-02-21 DIAGNOSIS — M7752 Other enthesopathy of left foot: Secondary | ICD-10-CM

## 2023-02-21 DIAGNOSIS — M722 Plantar fascial fibromatosis: Secondary | ICD-10-CM | POA: Diagnosis not present

## 2023-02-21 DIAGNOSIS — M7750 Other enthesopathy of unspecified foot: Secondary | ICD-10-CM

## 2023-02-21 DIAGNOSIS — M7732 Calcaneal spur, left foot: Secondary | ICD-10-CM

## 2023-02-21 NOTE — Patient Instructions (Signed)

## 2023-02-21 NOTE — Progress Notes (Signed)
  Subjective:  Patient ID: Barbara Walter, female    DOB: Mar 11, 1956,  MRN: [id number]  Chief Complaint  Patient presents with   Foot Pain    np - left plantar fasciitis ( heel & arch )    67 y.o. female presents with the above complaint. History confirmed with patient.  Walter worse in the morning going on for about a month has tried some stretching does not like to take Walter medications  Objective:  Physical Exam: warm, good capillary refill, no trophic changes or ulcerative lesions, normal DP and PT pulses, and normal sensory exam. Left Foot: point tenderness over the heel pad   Radiographs: Multiple views x-ray of the left foot: no fracture, dislocation, swelling or degenerative changes noted and plantar calcaneal spur Assessment:   1. Plantar fasciitis, left   2. Heel spur, left      Plan:  Patient was evaluated and treated and all questions answered.  Discussed the etiology and treatment options for plantar fasciitis including stretching, formal physical therapy, supportive shoegears such as a running shoe or sneaker, pre fabricated orthoses, injection therapy, and oral medications. We also discussed the role of surgical treatment of this for patients who do not improve after exhausting non-surgical treatment options.   -XR reviewed with patient -Educated patient on stretching and icing of the affected limb -Injection delivered to the plantar fascia of the left foot. -Has a history of hiatal hernia and GERD, not a good candidate for NSAIDs -I would recommend supporting the medial longitudinal arch and offloading the plantar fascia and heel spur.  She was casted for custom molded foot orthoses today.  She will be notified when these are ready for pickup.  Return in about 5 weeks (around 03/28/2023) for recheck plantar fasciitis.

## 2023-02-22 ENCOUNTER — Telehealth: Payer: Self-pay

## 2023-02-22 MED ORDER — NA SULFATE-K SULFATE-MG SULF 17.5-3.13-1.6 GM/177ML PO SOLN: 1.0000 | Freq: Once | ORAL | 0 refills | Status: AC

## 2023-02-22 NOTE — Telephone Encounter (Signed)
Gastroenterology Pre-Procedure Review  Request Date: 05/01/23 Requesting Physician: Dr. Servando Snare  PATIENT REVIEW QUESTIONS: The patient responded to the following health history questions as indicated:    1. Are you having any GI issues? no 2. Do you have a personal history of Polyps? yes (08/24/2016 performed by Dr. Servando Snare) 3. Do you have a family history of Colon Cancer or Polyps? no 4. Diabetes Mellitus? no 5. Joint replacements in the past 12 months?no 6. Major health problems in the past 3 months?no 7. Any artificial heart valves, MVP, or defibrillator?no    MEDICATIONS & ALLERGIES:    Patient reports the following regarding taking any anticoagulation/antiplatelet therapy:   Plavix, Coumadin, Eliquis, Xarelto, Lovenox, Pradaxa, Brilinta, or Effient? no Aspirin? no  Patient confirms/reports the following medications:  Current Outpatient Medications  Medication Sig Dispense Refill   albuterol (VENTOLIN HFA) 108 (90 Base) MCG/ACT inhaler Inhale 2 puffs into the lungs every 6 (six) hours as needed for wheezing or shortness of breath. 8 g 2   cetirizine (ZYRTEC) 10 MG tablet Take 10 mg by mouth daily.     hydrocortisone cream 1 % Apply 1 application topically as needed for itching.     levothyroxine (SYNTHROID) 100 MCG tablet TAKE 1 TABLET BY MOUTH EVERY DAY BEFORE BREAKFAST 90 tablet 3   No current facility-administered medications for this visit.    Patient confirms/reports the following allergies:  Allergies  Allergen Reactions   Milk-Related Compounds     Dairy causes eczema flares and affects breathing (not shortness of breath)    Penicillins Hives    Has patient had a PCN reaction causing immediate rash, facial/tongue/throat swelling, SOB or lightheadedness with hypotension: No Has patient had a PCN reaction causing severe rash involving mucus membranes or skin necrosis: Yes Has patient had a PCN reaction that required hospitalization: No Has patient had a PCN reaction  occurring within the last 10 years: No If all of the above answers are "NO", then may proceed with Cephalosporin use.    Sulfa Antibiotics Hives   Latex Hives    Diaphragm, gloves   Rosin [Pinus Strobus] Hives and Nausea Only    Eczema flare     No orders of the defined types were placed in this encounter.   AUTHORIZATION INFORMATION Primary Insurance: 1D#: Group #:  Secondary Insurance: 1D#: Group #:  SCHEDULE INFORMATION: Date: 05/01/23 Time: Location: [facility]

## 2023-03-14 ENCOUNTER — Telehealth: Payer: Self-pay | Admitting: Podiatry

## 2023-03-14 NOTE — Telephone Encounter (Signed)
Lmom for patient to call back and schedule picking up orthotics they are currently in [facility]   Balance is  [phone]

## 2023-03-23 ENCOUNTER — Ambulatory Visit
Admission: RE | Admit: 2023-03-23 | Discharge: 2023-03-23 | Disposition: A | Discharge status: Home / Self Care | Payer: BC Managed Care – PPO | Source: Ambulatory Visit | Attending: Internal Medicine | Admitting: Internal Medicine

## 2023-03-23 DIAGNOSIS — Z1231 Encounter for screening mammogram for malignant neoplasm of breast: Secondary | ICD-10-CM | POA: Diagnosis present

## 2023-03-23 DIAGNOSIS — Z13820 Encounter for screening for osteoporosis: Secondary | ICD-10-CM | POA: Insufficient documentation

## 2023-03-26 ENCOUNTER — Ambulatory Visit: Payer: BC Managed Care – PPO | Admitting: Podiatry

## 2023-03-26 ENCOUNTER — Other Ambulatory Visit: Payer: Self-pay | Admitting: Internal Medicine

## 2023-03-26 DIAGNOSIS — N6489 Other specified disorders of breast: Secondary | ICD-10-CM

## 2023-03-26 DIAGNOSIS — M722 Plantar fascial fibromatosis: Secondary | ICD-10-CM

## 2023-03-26 DIAGNOSIS — R928 Other abnormal and inconclusive findings on diagnostic imaging of breast: Secondary | ICD-10-CM

## 2023-03-27 NOTE — Progress Notes (Signed)
She returned to the office today for pickup of her orthotics these were assessed for form fit and function and she was pleased with them.  We reviewed the break-in process and she will let me know if there are any issues.  Has improved since the injection, return to see me as needed if it returns or worsens.

## 2023-03-28 ENCOUNTER — Ambulatory Visit: Payer: BC Managed Care – PPO | Admitting: Podiatry

## 2023-04-02 NOTE — Progress Notes (Unsigned)
   Acute Office Visit  Subjective:     Patient ID: Barbara Walter, female    DOB: September 05, 1956, 67 y.o.   MRN: [id number]  No chief complaint on file.   HPI Patient is in today for osteoporosis treatment options. Had a DEXA scan on 03/23/23 which showed a t score of -2/2 in the left hip, -2.9 in the lumbar spine and -3.1 in the right forearm.   ROS      Objective:    There were no vitals taken for this visit. {Vitals History (Optional):23777}  Physical Exam  No results found for any visits on 04/03/23.      Assessment & Plan:   Problem List Items Addressed This Visit   None   No orders of the defined types were placed in this encounter.   No follow-ups on file.  Margarita Mail, DO

## 2023-04-03 ENCOUNTER — Ambulatory Visit: Payer: BC Managed Care – PPO | Admitting: Internal Medicine

## 2023-04-03 VITALS — BP 122/78 | HR 84 | Temp 98.1°F | Resp 16 | Ht 63.25 in | Wt 172.6 lb

## 2023-04-03 DIAGNOSIS — M816 Localized osteoporosis [Lequesne]: Secondary | ICD-10-CM | POA: Diagnosis not present

## 2023-04-03 NOTE — Patient Instructions (Addendum)
It was great seeing you today!  Plan discussed at today's visit: -Recommend Vitamin D over the counter 1000 international units daily   -Recommend regular walking with ankle weights and forearm weights  -Information on treatment options below   Follow up in: August, already scheduled   Take care and let us know if you have any questions or concerns prior to your next visit.  Dr. Caralee Ates  Osteoporosis  Osteoporosis is when the bones get thin and weak. This can cause your bones to break (fracture) more easily. What are the causes? The exact cause of this condition is not known. What increases the risk? Having family members with this condition. Not eating enough healthy foods. Taking certain medicines. Being female. Being age 66 or older. Smoking or using other products that contain nicotine or tobacco, such as e-cigarettes or chewing tobacco. Not exercising. Being of European or Asian ancestry. Having a small body frame. What are the signs or symptoms? A broken bone might be the first sign, especially if the break results from a fall or injury that usually would not cause a bone to break. Other signs and symptoms include: Pain in the neck or low back. Being hunched over (stooped posture). Getting shorter. How is this treated? Eating more foods with more calcium and vitamin D in them. Doing exercises. Stopping tobacco use. Limiting how much alcohol you drink. Taking medicines to slow bone loss or help make the bones stronger. Taking supplements of calcium and vitamin D every day. Taking medicines to replace chemicals in the body (hormone replacement medicines). Monitoring your levels of calcium and vitamin D. The goal of treatment is to strengthen your bones and lower your risk for a bone break. Follow these instructions at home: Eating and drinking Eat plenty of calcium and vitamin D. These nutrients are good for your bones. Good sources of calcium and vitamin D  include: Some fish, such as salmon and tuna. Foods that have calcium and vitamin D added to them (fortified foods), such as some breakfast cereals. Egg yolks. Cheese. Liver.  Activity Do exercises as told by your doctor. Ask your doctor what exercises are safe for you. You should do: Exercises that make your muscles work to hold your body weight up (weight-bearing exercises). These include tai chi, yoga, and walking. Exercises to make your muscles stronger. One example is lifting weights. Lifestyle Do not drink alcohol if: Your doctor tells you not to drink. You are pregnant, may be pregnant, or are planning to become pregnant. If you drink alcohol: Limit how much you use to: 0-1 drink a day for women. 0-2 drinks a day for men. Know how much alcohol is in your drink. In the [place] one drink equals one 12 oz bottle of beer (355 mL), one 5 oz glass of wine (148 mL), or one 1 oz glass of hard liquor (44 mL). Do not smoke or use any products that contain nicotine or tobacco. If you need help quitting, ask your doctor. Preventing falls Use tools to help you move around (mobility aids) as needed. These include canes, walkers, scooters, and crutches. Keep rooms well-lit. Put away things on the floor that could make you trip. These include cords and rugs. Install safety rails on stairs. Install grab bars in bathrooms. Use rubber mats in slippery areas, like bathrooms. Wear shoes that: Fit you well. Support your feet. Have closed toes. Have rubber soles or low heels. Tell your doctor about all of the medicines you are taking. Some  medicines can make you more likely to fall. General instructions Take over-the-counter and prescription medicines only as told by your doctor. Keep all follow-up visits. Contact a doctor if: You have not been tested (screened) for osteoporosis and you are: A woman who is age 26 or older. A man who is age 51 or older. Get help right away if: You fall. You  get hurt. Summary Osteoporosis happens when your bones get thin and weak. Weak bones can break (fracture) more easily. Eat plenty of calcium and vitamin D. These are good for your bones. Tell your doctor about all of the medicines that you take. This information is not intended to replace advice given to you by your health care provider. Make sure you discuss any questions you have with your health care provider. Document Revised: 03/18/2020 Document Reviewed: 03/18/2020 Elsevier Patient Education  2024 Elsevier Inc.  Alendronate Weekly Tablets What is this medication? ALENDRONATE (a LEN droe nate) treats osteoporosis. It works by Interior and spatial designer stronger and less likely to break (fracture). It belongs to a group of medications called bisphosphonates. This medicine may be used for other purposes; ask your health care provider or pharmacist if you have questions. COMMON BRAND NAME(S): Fosamax What should I tell my care team before I take this medication? They need to know if you have any of these conditions: Bleeding disorder Cancer Dental disease Difficulty swallowing Infection (fever, chills, cough, sore throat, pain or trouble passing urine) Kidney disease Low levels of calcium or other minerals in the blood Low red blood cell counts Receiving steroids like dexamethasone or prednisone Stomach or intestine problems Trouble sitting or standing for 30 minutes An unusual or allergic reaction to alendronate, other medications, foods, dyes or preservatives Pregnant or trying to get pregnant Breast-feeding How should I use this medication? Take this medication by mouth with a full glass of water. Take it as directed on the prescription label at the same day of each week. Take the dose right after waking up. Do not eat or drink anything before taking it. Do not take it with any other drink except water. Do not chew or crush the tablet. After taking it, do not eat breakfast, drink, or  take any other medications or vitamins for at least 30 minutes. Sit or stand up for at least 30 minutes after you take it. Do not lie down. Keep taking it unless your care team tells you to stop. A special MedGuide will be given to you by the pharmacist with each prescription and refill. Be sure to read this information carefully each time. Talk to your care team about the use of this medication in children. Special care may be needed. Overdosage: If you think you have taken too much of this medicine contact a poison control center or emergency room at once. NOTE: This medicine is only for you. Do not share this medicine with others. What if I miss a dose? If you take your medication once a day, skip it. Take your next dose at the scheduled time the next morning. Do not take two doses on the same day. If you take your medication once a week, take the missed dose on the morning after you remember. Do not take two doses on the same day. What may interact with this medication? Aluminum hydroxide Antacids Aspirin Calcium supplements Iron supplements Medications for inflammation like ibuprofen, naproxen, and others Magnesium supplements Vitamins with minerals This list may not describe all possible interactions. Give your health care  provider a list of all the medicines, herbs, non-prescription drugs, or dietary supplements you use. Also tell them if you smoke, drink alcohol, or use illegal drugs. Some items may interact with your medicine. What should I watch for while using this medication? Visit your care team for regular checks on your progress. It may be some time before you see the benefit from this medication. Some people who take this medication have severe bone, joint, or muscle pain. This medication may also increase your risk for jaw problems or a broken thigh bone. Tell your care team right away if you have severe pain in your jaw, bones, joints, or muscles. Tell you care team if you have  any pain that does not go away or that gets worse. Tell your dentist and dental surgeon that you are taking this medication. You should not have major dental surgery while on this medication. See your dentist to have a dental exam and fix any dental problems before starting this medication. Take good care of your teeth while on this medication. Make sure you see your dentist for regular follow-up appointments. You should make sure you get enough calcium and vitamin D while you are taking this medication. Discuss the foods you eat and the vitamins you take with your care team. You may need blood work done while you are taking this medication. What side effects may I notice from receiving this medication? Side effects that you should report to your care team as soon as possible: Allergic reactions--skin rash, itching, hives, swelling of the face, lips, tongue, or throat Low calcium level--muscle pain or cramps, confusion, tingling, or numbness in the hands or feet Osteonecrosis of the jaw--pain, swelling, or redness in the mouth, numbness of the jaw, poor healing after dental work, unusual discharge from the mouth, visible bones in the mouth Pain or trouble swallowing Severe bone, joint, or muscle pain Stomach bleeding--bloody or black, tar-like stools, vomiting blood or brown material that looks like coffee grounds Side effects that usually do not require medical attention (report to your care team if they continue or are bothersome): Constipation Diarrhea Nausea Stomach pain This list may not describe all possible side effects. Call your doctor for medical advice about side effects. You may report side effects to FDA at 1-800-FDA-1088. Where should I keep my medication? Keep out of the reach of children and pets. Store at room temperature between 15 and 30 degrees C (59 and 86 degrees F). Throw away any unused medication after the expiration date. NOTE: This sheet is a summary. It may not cover  all possible information. If you have questions about this medicine, talk to your doctor, pharmacist, or health care provider.  2024 Elsevier/Gold Standard (2020-09-27 00:00:00)  Denosumab Injection (Osteoporosis) What is this medication? DENOSUMAB (den oh SUE mab) prevents and treats osteoporosis. It works by Interior and spatial designer stronger and less likely to break (fracture). It is a monoclonal antibody. This medicine may be used for other purposes; ask your health care provider or pharmacist if you have questions. COMMON BRAND NAME(S): Prolia What should I tell my care team before I take this medication? They need to know if you have any of these conditions: Dental or gum disease Had thyroid or parathyroid (glands located in neck) surgery Having dental surgery or a tooth pulled Kidney disease Low levels of calcium in the blood On dialysis Poor nutrition Thyroid disease Trouble absorbing nutrients from your food An unusual or allergic reaction to denosumab, other medications, foods,  dyes, or preservatives Pregnant or trying to get pregnant Breastfeeding How should I use this medication? This medication is injected under the skin. It is given by your care team in a hospital or clinic setting. A special MedGuide will be given to you before each treatment. Be sure to read this information carefully each time. Talk to your care team about the use of this medication in children. Special care may be needed. Overdosage: If you think you have taken too much of this medicine contact a poison control center or emergency room at once. NOTE: This medicine is only for you. Do not share this medicine with others. What if I miss a dose? Keep appointments for follow-up doses. It is important not to miss your dose. Call your care team if you are unable to keep an appointment. What may interact with this medication? Do not take this medication with any of the following: Other medications that contain  denosumab This medication may also interact with the following: Medications that lower your chance of fighting infection Steroid medications, such as prednisone or cortisone This list may not describe all possible interactions. Give your health care provider a list of all the medicines, herbs, non-prescription drugs, or dietary supplements you use. Also tell them if you smoke, drink alcohol, or use illegal drugs. Some items may interact with your medicine. What should I watch for while using this medication? Your condition will be monitored carefully while you are receiving this medication. You may need blood work done while taking this medication. This medication may increase your risk of getting an infection. Call your care team for advice if you get a fever, chills, sore throat, or other symptoms of a cold or flu. Do not treat yourself. Try to avoid being around people who are sick. Tell your dentist and dental surgeon that you are taking this medication. You should not have major dental surgery while on this medication. See your dentist to have a dental exam and fix any dental problems before starting this medication. Take good care of your teeth while on this medication. Make sure you see your dentist for regular follow-up appointments. This medication may cause low levels of calcium in your body. The risk of severe side effects is increased in people with kidney disease. Your care team may prescribe calcium and vitamin D to help prevent low calcium levels while you take this medication. It is important to take calcium and vitamin D as directed by your care team. Talk to your care team if you may be pregnant. Serious birth defects may occur if you take this medication during pregnancy and for 5 months after the last dose. You will need a negative pregnancy test before starting this medication. Contraception is recommended while taking this medication and for 5 months after the last dose. Your care  team can help you find the option that works for you. Talk to your care team before breastfeeding. Changes to your treatment plan may be needed. What side effects may I notice from receiving this medication? Side effects that you should report to your care team as soon as possible: Allergic reactions--skin rash, itching, hives, swelling of the face, lips, tongue, or throat Infection--fever, chills, cough, sore throat, wounds that don't heal, pain or trouble when passing urine, general feeling of discomfort or being unwell Low calcium level--muscle pain or cramps, confusion, tingling, or numbness in the hands or feet Osteonecrosis of the jaw--pain, swelling, or redness in the mouth, numbness of the jaw,  poor healing after dental work, unusual discharge from the mouth, visible bones in the mouth Severe bone, joint, or muscle pain Skin infection--skin redness, swelling, warmth, or pain Side effects that usually do not require medical attention (report these to your care team if they continue or are bothersome): Back pain Headache Joint pain Muscle pain Pain in the hands, arms, legs, or feet Runny or stuffy nose Sore throat This list may not describe all possible side effects. Call your doctor for medical advice about side effects. You may report side effects to FDA at 1-800-FDA-1088. Where should I keep my medication? This medication is given in a hospital or clinic. It will not be stored at home. NOTE: This sheet is a summary. It may not cover all possible information. If you have questions about this medicine, talk to your doctor, pharmacist, or health care provider.  2024 Elsevier/Gold Standard (2022-11-07 00:00:00)

## 2023-04-04 ENCOUNTER — Ambulatory Visit
Admission: RE | Admit: 2023-04-04 | Discharge: 2023-04-04 | Disposition: A | Discharge status: Home / Self Care | Payer: BC Managed Care – PPO | Source: Ambulatory Visit | Attending: Internal Medicine | Admitting: Internal Medicine

## 2023-04-04 DIAGNOSIS — R928 Other abnormal and inconclusive findings on diagnostic imaging of breast: Secondary | ICD-10-CM

## 2023-04-04 DIAGNOSIS — N6489 Other specified disorders of breast: Secondary | ICD-10-CM

## 2023-04-30 ENCOUNTER — Other Ambulatory Visit: Payer: Self-pay | Admitting: *Deleted

## 2023-04-30 DIAGNOSIS — Z1211 Encounter for screening for malignant neoplasm of colon: Secondary | ICD-10-CM

## 2023-04-30 DIAGNOSIS — Z86010 Personal history of colonic polyps: Secondary | ICD-10-CM

## 2023-05-01 ENCOUNTER — Ambulatory Visit
Admission: RE | Admit: 2023-05-01 | Discharge: 2023-05-01 | Disposition: A | Discharge status: Home / Self Care | Payer: BC Managed Care – PPO | Attending: Gastroenterology | Admitting: Gastroenterology

## 2023-05-01 ENCOUNTER — Encounter
Admission: RE | Disposition: A | Discharge status: Home / Self Care | Payer: Self-pay | Source: Home / Self Care | Attending: Gastroenterology

## 2023-05-01 ENCOUNTER — Ambulatory Visit: Payer: BC Managed Care – PPO | Admitting: Anesthesiology

## 2023-05-01 ENCOUNTER — Encounter: Payer: Self-pay | Admitting: Gastroenterology

## 2023-05-01 DIAGNOSIS — Z09 Encounter for follow-up examination after completed treatment for conditions other than malignant neoplasm: Secondary | ICD-10-CM | POA: Insufficient documentation

## 2023-05-01 DIAGNOSIS — K635 Polyp of colon: Secondary | ICD-10-CM | POA: Insufficient documentation

## 2023-05-01 DIAGNOSIS — Z87891 Personal history of nicotine dependence: Secondary | ICD-10-CM | POA: Insufficient documentation

## 2023-05-01 DIAGNOSIS — Z86010 Personal history of colonic polyps: Secondary | ICD-10-CM | POA: Diagnosis not present

## 2023-05-01 DIAGNOSIS — Z1211 Encounter for screening for malignant neoplasm of colon: Secondary | ICD-10-CM | POA: Insufficient documentation

## 2023-05-01 DIAGNOSIS — E039 Hypothyroidism, unspecified: Secondary | ICD-10-CM | POA: Diagnosis not present

## 2023-05-01 DIAGNOSIS — K640 First degree hemorrhoids: Secondary | ICD-10-CM | POA: Diagnosis not present

## 2023-05-01 HISTORY — PX: POLYPECTOMY: SHX5525

## 2023-05-01 HISTORY — PX: COLONOSCOPY WITH PROPOFOL: SHX5780

## 2023-05-01 SURGERY — COLONOSCOPY WITH PROPOFOL
Anesthesia: General

## 2023-05-01 MED ORDER — SODIUM CHLORIDE 0.9 % IV SOLN: INTRAVENOUS | Status: DC

## 2023-05-01 MED ORDER — PROPOFOL 500 MG/50ML IV EMUL
INTRAVENOUS | Status: DC | PRN
  Administered 2023-05-01: 150 ug/kg/min via INTRAVENOUS

## 2023-05-01 MED ORDER — LIDOCAINE HCL (CARDIAC) PF 100 MG/5ML IV SOSY
PREFILLED_SYRINGE | INTRAVENOUS | Status: DC | PRN
  Administered 2023-05-01: 50 mg via INTRAVENOUS

## 2023-05-01 MED ORDER — PROPOFOL 10 MG/ML IV BOLUS
INTRAVENOUS | Status: DC | PRN
  Administered 2023-05-01: 60 mg via INTRAVENOUS

## 2023-05-01 MED ORDER — PROPOFOL 1000 MG/100ML IV EMUL
INTRAVENOUS | Status: AC
  Filled 2023-05-01: qty 100

## 2023-05-01 MED ORDER — LIDOCAINE HCL (PF) 2 % IJ SOLN
INTRAMUSCULAR | Status: AC
  Filled 2023-05-01: qty 5

## 2023-05-01 NOTE — Anesthesia Preprocedure Evaluation (Addendum)
Anesthesia Evaluation  Patient identified by MRN, date of birth, ID band Patient awake    Reviewed: Allergy & Precautions, NPO status , Patient's Chart, lab work & pertinent test results  History of Anesthesia Complications Negative for: history of anesthetic complications  Airway Mallampati: IV   Neck ROM: Full    Dental  (+) Missing Crowns :   Pulmonary former smoker (quit age 67)   Pulmonary exam normal breath sounds clear to auscultation       Cardiovascular Exercise Tolerance: Good negative cardio ROS Normal cardiovascular exam Rhythm:Regular Rate:Normal     Neuro/Psych  Headaches    GI/Hepatic hiatal hernia,GERD  ,,  Endo/Other  Hypothyroidism    Renal/GU negative Renal ROS     Musculoskeletal   Abdominal   Peds  Hematology  (+) Blood dyscrasia, anemia   Anesthesia Other Findings   Reproductive/Obstetrics                             Anesthesia Physical Anesthesia Plan  ASA: 2  Anesthesia Plan: General   Post-op Pain Management:    Induction: Intravenous  PONV Risk Score and Plan: 3 and Propofol infusion, TIVA and Treatment may vary due to age or medical condition  Airway Management Planned: Natural Airway  Additional Equipment:   Intra-op Plan:   Post-operative Plan:   Informed Consent: I have reviewed the patients History and Physical, chart, labs and discussed the procedure including the risks, benefits and alternatives for the proposed anesthesia with the patient or authorized representative who has indicated his/her understanding and acceptance.       Plan Discussed with: CRNA  Anesthesia Plan Comments: (LMA/GETA backup discussed.  Patient consented for risks of anesthesia including but not limited to:  - adverse reactions to medications - damage to eyes, teeth, lips or other oral mucosa - nerve damage due to positioning  - sore throat or hoarseness -  damage to heart, brain, nerves, lungs, other parts of body or loss of life  Informed patient about role of CRNA in peri- and intra-operative care.  Patient voiced understanding.)       Anesthesia Quick Evaluation

## 2023-05-01 NOTE — Anesthesia Postprocedure Evaluation (Signed)
Anesthesia Post Note  Patient: Barbara Walter  Procedure(s) Performed: COLONOSCOPY WITH PROPOFOL  Patient location during evaluation: PACU Anesthesia Type: General Level of consciousness: awake and alert, oriented and patient cooperative Pain management: pain level controlled Vital Signs Assessment: post-procedure vital signs reviewed and stable Respiratory status: spontaneous breathing, nonlabored ventilation and respiratory function stable Cardiovascular status: blood pressure returned to baseline and stable Postop Assessment: adequate PO intake Anesthetic complications: no   No notable events documented.   Last Vitals:  Vitals:   05/01/23 0802 05/01/23 0812  BP:  108/88  Pulse:    Resp: 17   Temp:    SpO2:      Last Pain:  Vitals:   05/01/23 0812  TempSrc:   PainSc: 0-No pain                 Reed Breech

## 2023-05-01 NOTE — H&P (Signed)
[name], MD [facility] [address] [place], [place] [address] [phone] Fax : [phone]  Primary Care Physician:  Margarita Mail, DO Primary Gastroenterologist:  Dr. Servando Snare  Pre-Procedure History & Physical: HPI:  Barbara Walter is a 67 y.o. female is here for an colonoscopy.   Past Medical History:  Diagnosis Date   Anemia    Eczema    GERD (gastroesophageal reflux disease)    no meds   Hemorrhoids    History of hiatal hernia    Hypothyroidism    Migraines    food and stress induced    Past Surgical History:  Procedure Laterality Date   COLONOSCOPY WITH PROPOFOL N/A 08/24/2016   Procedure: COLONOSCOPY WITH PROPOFOL;  Surgeon: [name], MD;  Location: [facility];  Service: Endoscopy;  Laterality: N/A;  Latex sensitivity   DILATION AND CURETTAGE OF UTERUS     HYSTEROSCOPY WITH D & C N/A 02/28/2018   Procedure: DILATATION AND CURETTAGE [name];  Surgeon: Vena Austria, MD;  Location: [facility];  Service: Gynecology;  Laterality: N/A;   POLYPECTOMY  08/24/2016   Procedure: POLYPECTOMY;  Surgeon: [name], MD;  Location: [facility];  Service: Endoscopy;;    Prior to Admission medications   Medication Sig Start Date End Date Taking? Authorizing Provider  albuterol (VENTOLIN HFA) 108 (90 Base) MCG/ACT inhaler Inhale 2 puffs into the lungs every 6 (six) hours as needed for wheezing or shortness of breath. 02/14/23  Yes Margarita Mail, DO  cetirizine (ZYRTEC) 10 MG tablet Take 10 mg by mouth daily.   Yes [provider]  levothyroxine (SYNTHROID) 100 MCG tablet TAKE 1 TABLET BY MOUTH EVERY DAY BEFORE BREAKFAST 02/15/23  Yes Margarita Mail, DO  hydrocortisone cream 1 % Apply 1 application topically as needed for itching.    [provider]    Allergies as of 04/30/2023 - Review Complete 04/30/2023  Allergen Reaction Noted   Milk-related compounds  08/16/2016   Penicillins Hives  07/10/2016   Sulfa antibiotics Hives 07/10/2016   Latex Hives 08/10/2016   Rosin [pinus strobus] Hives and Nausea Only 02/15/2018    Family History  Problem Relation Age of Onset   Hypertension Mother    Breast cancer Maternal Grandmother     Social History   Socioeconomic History   Marital status: Married    Spouse name: Not on file   Number of children: Not on file   Years of education: Not on file   Highest education level: Not on file  Occupational History   Not on file  Tobacco Use   Smoking status: Former    Current packs/day: 0.50    Types: Cigarettes   Smokeless tobacco: Never  Vaping Use   Vaping status: Never Used  Substance and Sexual Activity   Alcohol use: No   Drug use: No   Sexual activity: Yes    Birth control/protection: None  Other Topics Concern   Not on file  Social History Narrative   Not on file   Social Determinants of Health   Financial Resource Strain: Not on file  Food Insecurity: Not on file  Transportation Needs: Not on file  Physical Activity: Not on file  Stress: Not on file  Social Connections: Not on file  Intimate Partner Violence: Not on file    Review of Systems: See HPI, otherwise negative ROS  Physical Exam: There were no vitals taken for this visit. General:   Alert,  pleasant and cooperative in NAD  Head:  Normocephalic and atraumatic. Neck:  Supple; no masses or thyromegaly. Lungs:  Clear throughout to auscultation.    Heart:  Regular rate and rhythm. Abdomen:  Soft, nontender and nondistended. Normal bowel sounds, without guarding, and without rebound.   Neurologic:  Alert and  oriented x4;  grossly normal neurologically.  Impression/Plan: [name] is here for an colonoscopy to be performed for a history of adenomatous polyps on 2017   Risks, benefits, limitations, and alternatives regarding  colonoscopy have been reviewed with the patient.  Questions have been answered.  All parties  agreeable.   [name], MD  05/01/2023, 7:03 AM

## 2023-05-01 NOTE — Transfer of Care (Signed)
Immediate Anesthesia Transfer of Care Note  Patient: Barbara Walter  Procedure(s) Performed: COLONOSCOPY WITH PROPOFOL  Patient Location: PACU and Endoscopy Unit  Anesthesia Type:General  Level of Consciousness: drowsy and patient cooperative  Airway & Oxygen Therapy: Patient Spontanous Breathing and Patient connected to nasal cannula oxygen  Post-op Assessment: Report given to RN and Post -op Vital signs reviewed and stable  Post vital signs: Reviewed and stable  Last Vitals:  Vitals Value Taken Time  BP 106/77   Temp    Pulse 78   Resp 20 05/01/23 0752  SpO2 98   Vitals shown include unfiled device data.  Last Pain:  Vitals:   05/01/23 0705  TempSrc: Temporal         Complications: No notable events documented.

## 2023-05-01 NOTE — Anesthesia Procedure Notes (Signed)
Date/Time: 05/01/2023 7:30 AM  Performed by: Elmarie Mainland, CRNAPre-anesthesia Checklist: Patient identified, Emergency Drugs available, Suction available and Patient being monitored Patient Re-evaluated:Patient Re-evaluated prior to induction Oxygen Delivery Method: Nasal cannula Preoxygenation: Pre-oxygenation with 100% oxygen

## 2023-05-01 NOTE — Op Note (Signed)
[facility] Gastroenterology Patient Name: Barbara Walter Procedure Date: 05/01/2023 7:26 AM MRN: [id number] Account #: [id number] Date of Birth: May 25, 1956 Admit Type: Outpatient Age: 67 Room: [facility] ENDO ROOM 4 Gender: Female Note Status: Finalized Instrument Name: Nelda Marseille [id number] Procedure:             Colonoscopy Indications:           High risk colon cancer surveillance: Personal history                         of colonic polyps Providers:             [name] MD, MD Referring MD:          No Local Md, MD (Referring MD) Medicines:             Propofol per Anesthesia Complications:         No immediate complications. Procedure:             Pre-Anesthesia Assessment:                        - Prior to the procedure, a History and Physical was                         performed, and patient medications and allergies were                         reviewed. The patient's tolerance of previous                         anesthesia was also reviewed. The risks and benefits                         of the procedure and the sedation options and risks                         were discussed with the patient. All questions were                         answered, and informed consent was obtained. Prior                         Anticoagulants: The patient has taken no anticoagulant                         or antiplatelet agents. ASA Grade Assessment: II - A                         patient with mild systemic disease. After reviewing                         the risks and benefits, the patient was deemed in                         satisfactory condition to undergo the procedure.                        After obtaining informed consent, the colonoscope was  passed under direct vision. Throughout the procedure,                         the patient's blood pressure, pulse, and oxygen                         saturations were monitored continuously. The                          Colonoscope was introduced through the anus and                         advanced to the the cecum, identified by appendiceal                         orifice and ileocecal valve. The colonoscopy was                         performed without difficulty. The patient tolerated                         the procedure well. The quality of the bowel                         preparation was excellent. Findings:      The perianal and digital rectal examinations were normal.      Two sessile polyps were found in the descending colon. The polyps were 3       to 5 mm in size. These polyps were removed with a cold snare. Resection       and retrieval were complete.      Non-bleeding internal hemorrhoids were found during retroflexion. The       hemorrhoids were Grade I (internal hemorrhoids that do not prolapse). Impression:            - Two 3 to 5 mm polyps in the descending colon,                         removed with a cold snare. Resected and retrieved.                        - Non-bleeding internal hemorrhoids. Recommendation:        - Discharge patient to home.                        - Resume previous diet.                        - Continue present medications.                        - Await pathology results.                        - Repeat colonoscopy in 7 years for surveillance. Procedure Code(s):     --- Professional ---                        [phone], Colonoscopy, flexible; with removal of  tumor(s), polyp(s), or other lesion(s) by snare                         technique Diagnosis Code(s):     --- Professional ---                        Z86.010, Personal history of colonic polyps                        D12.4, Benign neoplasm of descending colon CPT copyright 2022 American Medical Association. All rights reserved. The codes documented in this report are preliminary and upon coder review may  be revised to meet current compliance requirements. [name] MD,  MD 05/01/2023 7:50:18 AM This report has been signed electronically. Number of Addenda: 0 Note Initiated On: 05/01/2023 7:26 AM Scope Withdrawal Time: 0 hours 8 minutes 24 seconds  Total Procedure Duration: 0 hours 17 minutes 20 seconds  Estimated Blood Loss:  Estimated blood loss: none.      [facility]

## 2023-05-02 ENCOUNTER — Encounter: Payer: Self-pay | Admitting: Gastroenterology

## 2023-05-05 ENCOUNTER — Encounter: Payer: Self-pay | Admitting: Gastroenterology

## 2023-05-13 ENCOUNTER — Encounter: Payer: Self-pay | Admitting: Internal Medicine

## 2023-05-17 NOTE — Progress Notes (Unsigned)
Name: Barbara Walter   MRN: [id number]    DOB: Sep 27, 1956   Date:05/21/2023       Progress Note  Subjective  Chief Complaint  Chief Complaint  Patient presents with   Annual Exam    HPI  Patient presents for annual CPE.  Diet: well rounded diet Exercise: walking  Last Eye Exam: completed this yr Last Dental Exam: yrly dental exam   Depression: Phq 9 is  negative    05/21/2023    9:37 AM 04/03/2023    1:12 PM 02/14/2023    9:19 AM 01/07/2021    9:04 AM 04/22/2020    8:49 AM  Depression screen PHQ 2/9  Decreased Interest 0 0 0 0 0  Down, Depressed, Hopeless 0 0 0 0 0  PHQ - 2 Score 0 0 0 0 0  Altered sleeping 0 0 0 0   Tired, decreased energy 0 0 0 0   Change in appetite 0 0 0 0   Feeling bad or failure about yourself  0 0 0 0   Trouble concentrating 0 0 0 0   Moving slowly or fidgety/restless 0 0 0 0   Suicidal thoughts 0 0 0 0   PHQ-9 Score 0 0 0 0   Difficult doing work/chores Not difficult at all Not difficult at all Not difficult at all Not difficult at all    Hypertension: BP Readings from Last 3 Encounters:  05/21/23 134/84  05/01/23 108/88  04/03/23 122/78   Obesity: Wt Readings from Last 3 Encounters:  05/21/23 178 lb (80.7 kg)  05/01/23 170 lb (77.1 kg)  04/03/23 172 lb 9.6 oz (78.3 kg)   BMI Readings from Last 3 Encounters:  05/21/23 31.28 kg/m  05/01/23 29.88 kg/m  04/03/23 30.33 kg/m     Vaccines:   Tdap: 2021 Shingrix: will get today Pneumonia: UTD Flu: due in the fall  COVID-19:N/A  Hep C Screening: 2024 STD testing and prevention (HIV/chl/gon/syphilis): no concerns Intimate partner violence: negative screen  LMP: 2007 Discussed importance of follow up if any post-menopausal bleeding: yes  Incontinence Symptoms: negative for symptoms   Breast cancer:  - Last Mammogram: 04/04/23  Osteoporosis Prevention : Discussed high calcium and vitamin D supplementation, weight bearing exercises. Will start bisphosphonate today Bone density  :yes   Cervical cancer screening: Due today  Skin cancer: Discussed monitoring for atypical lesions  Colorectal cancer: Colonoscopy 05/01/23, repeat in 7 years   Lung cancer:  Low Dose CT Chest recommended if Age 53-80 years, 20 pack-year currently smoking OR have quit w/in 15years. Patient does not qualify for screen     Advanced Care Planning: A voluntary discussion about advance care planning including the explanation and discussion of advance directives.  Discussed health care proxy and Living will, and the patient was able to identify a health care proxy as husband Carvel Getting.  Patient does not have a living will and power of attorney of health care   Lipids: Lab Results  Component Value Date   CHOL 153 05/04/2022   CHOL 215 (H) 04/22/2020   Lab Results  Component Value Date   HDL 37 (L) 05/04/2022   HDL 48 (L) 04/22/2020   Lab Results  Component Value Date   LDLCALC 96 05/04/2022   LDLCALC 142 (H) 04/22/2020   Lab Results  Component Value Date   TRIG 107 05/04/2022   TRIG 125 04/22/2020   Lab Results  Component Value Date   CHOLHDL 4.1 05/04/2022   CHOLHDL 4.5  04/22/2020   No results found for: "LDLDIRECT"  Glucose: Glucose, Bld  Date Value Ref Range Status  05/04/2022 96 65 - 99 mg/dL Final    Comment:    .            Fasting reference interval .   04/22/2020 91 65 - 99 mg/dL Final    Comment:    .            Fasting reference interval .     Patient Active Problem List   Diagnosis Date Noted   Migraines    Annual physical exam 04/22/2020   Hypothyroidism 04/22/2020   Postmenopausal bleeding 01/22/2018   Cervical polyp 01/22/2018   Hx of colonic polyps    Benign neoplasm of descending colon    Polyp of sigmoid colon     Past Surgical History:  Procedure Laterality Date   COLONOSCOPY WITH PROPOFOL N/A 08/24/2016   Procedure: COLONOSCOPY WITH PROPOFOL;  Surgeon: [name], MD;  Location: [facility];  Service: Endoscopy;   Laterality: N/A;  Latex sensitivity   COLONOSCOPY WITH PROPOFOL N/A 05/01/2023   Procedure: COLONOSCOPY WITH PROPOFOL;  Surgeon: [name], MD;  [facility]: [facility];  Service: Endoscopy;  Laterality: N/A;   DILATION AND CURETTAGE OF UTERUS     HYSTEROSCOPY WITH D & C N/A 02/28/2018   Procedure: DILATATION AND CURETTAGE [name];  Surgeon: Vena Austria, MD;  Location: [facility] OR[facility];  Service: Gynecology;  Laterality: N/A;   POLYPECTOMY  08/24/2016   Procedure: POLYPECTOMY;  Surgeon: [name], MD;  Location: [facility];  Service: Endoscopy;;   POLYPECTOMY  05/01/2023   Procedure: POLYPECTOMY;  Surgeon: [name], MD;  Location: [facility] ENDOSCOPY;  Service: Endoscopy;;    Family History  Problem Relation Age of Onset   Hypertension Mother    Breast cancer Maternal Grandmother     Social History   Socioeconomic History   Marital status: Married    Spouse name: Not on file   Number of children: Not on file   Years of education: Not on file   Highest education level: Not on file  Occupational History   Not on file  Tobacco Use   Smoking status: Former    Current packs/day: 0.50    Types: Cigarettes   Smokeless tobacco: Never  Vaping Use   Vaping status: Never Used  Substance and Sexual Activity   Alcohol use: No   Drug use: No   Sexual activity: Yes    Birth control/protection: None  Other Topics Concern   Not on file  Social History Narrative   Not on file   Social Determinants of Health   Financial Resource Strain: Not on file  Food Insecurity: Not on file  Transportation Needs: Not on file  Physical Activity: Not on file  Stress: Not on file  Social Connections: Not on file  Intimate Partner Violence: Not on file     Current Outpatient Medications:    albuterol (VENTOLIN HFA) 108 (90 Base) MCG/ACT inhaler, Inhale 2 puffs into the lungs every 6 (six) hours as needed for wheezing or shortness of breath., Disp: 8 g, Rfl: 2    cetirizine (ZYRTEC) 10 MG tablet, Take 10 mg by mouth daily., Disp: , Rfl:    hydrocortisone cream 1 %, Apply 1 application topically as needed for itching., Disp: , Rfl:    levothyroxine (SYNTHROID) 100 MCG tablet, TAKE 1 TABLET BY MOUTH EVERY DAY BEFORE BREAKFAST, Disp: 90 tablet, Rfl: 3  Allergies  Allergen Reactions   Milk-Related Compounds     Dairy causes eczema flares and affects breathing (not shortness of breath)    Penicillins Hives    Has patient had a PCN reaction causing immediate rash, facial/tongue/throat swelling, SOB or lightheadedness with hypotension: No Has patient had a PCN reaction causing severe rash involving mucus membranes or skin necrosis: Yes Has patient had a PCN reaction that required hospitalization: No Has patient had a PCN reaction occurring within the last 10 years: No If all of the above answers are "NO", then may proceed with Cephalosporin use.    Sulfa Antibiotics Hives   Latex Hives    Diaphragm, gloves   Rosin [Pinus Strobus] Hives and Nausea Only    Eczema flare      Review of Systems  All other systems reviewed and are negative.    Objective  Vitals:   05/21/23 0936  BP: 134/84  Pulse: 75  Resp: 18  Temp: (!) 97.5 F (36.4 C)  SpO2: 98%  Weight: 178 lb (80.7 kg)  Height: 5' 3.25" (1.607 m)    Body mass index is 31.28 kg/m.  Physical Exam Exam conducted with a chaperone present.  Constitutional:      Appearance: Normal appearance.  HENT:     Head: Normocephalic and atraumatic.  Eyes:     Conjunctiva/sclera: Conjunctivae normal.  Cardiovascular:     Rate and Rhythm: Normal rate and regular rhythm.  Pulmonary:     Effort: Pulmonary effort is normal.     Breath sounds: Normal breath sounds.  Chest:  Breasts:    Right: Normal.     Left: Normal.  Genitourinary:    Comments: External genitalia within normal limits.  Vaginal mucosa pink, moist, normal rugae.  Nonfriable cervix without lesions, no discharge or bleeding  noted on speculum exam.  Lymphadenopathy:     Upper Body:     Right upper body: No supraclavicular, axillary or pectoral adenopathy.     Left upper body: No supraclavicular, axillary or pectoral adenopathy.  Skin:    General: Skin is warm and dry.  Neurological:     General: No focal deficit present.     Mental Status: She is alert. Mental status is at baseline.  Psychiatric:        Mood and Affect: Mood normal.        Behavior: Behavior normal.     No results found for this or any previous visit (from the past 2160 hour(s)).   Fall Risk:    05/21/2023    9:37 AM 04/03/2023    1:12 PM 02/14/2023    9:19 AM 04/22/2020    8:48 AM  Fall Risk   Falls in the past year? 0 0 0 0  Number falls in past yr: 0 0 0 0  Injury with Fall? 0 0 0 0  Risk for fall due to :    No Fall Risks    Functional Status Survey: Is the patient deaf or have difficulty hearing?: No Does the patient have difficulty seeing, even when wearing glasses/contacts?: No Does the patient have difficulty concentrating, remembering, or making decisions?: No Does the patient have difficulty walking or climbing stairs?: No Does the patient have difficulty dressing or bathing?: No Does the patient have difficulty doing errands alone such as visiting a doctor's office or shopping?: No  Assessment & Plan  1. Annual physical exam: Health maintenance reviewed.  - Cytology - PAP - Zoster Recombinant (Shingrix )  2. Screening for  cervical cancer: Pap done today. - Cytology - PAP  3. Localized osteoporosis without current pathological fracture: Patient has increased dietary vitamin D and exercise, willing to start Fosamax weekly will follow-up in 3 months to see how she is doing with this.  - alendronate (FOSAMAX) 70 MG tablet; Take 1 tablet (70 mg total) by mouth every 7 (seven) days. Take with a full glass of water on an empty stomach.  Dispense: 4 tablet; Refill: 11  4. Need for shingles vaccine: Shingles vaccine  administered today.  - Zoster Recombinant (Shingrix )   -USPSTF grade A and B recommendations reviewed with patient; age-appropriate recommendations, preventive care, screening tests, etc discussed and encouraged; healthy living encouraged; see AVS for patient education given to patient -Discussed importance of 150 minutes of physical activity weekly, eat two servings of fish weekly, eat one serving of tree nuts ( cashews, pistachios, pecans, almonds.Marland Kitchen) every other day, eat 6 servings of fruit/vegetables daily and drink plenty of water and avoid sweet beverages.   -Reviewed Health Maintenance: Yes.

## 2023-05-21 ENCOUNTER — Ambulatory Visit: Payer: BC Managed Care – PPO | Admitting: Internal Medicine

## 2023-05-21 ENCOUNTER — Ambulatory Visit: Payer: BC Managed Care – PPO | Admitting: Podiatry

## 2023-05-21 ENCOUNTER — Encounter: Payer: Self-pay | Admitting: Podiatry

## 2023-05-21 ENCOUNTER — Encounter: Payer: Self-pay | Admitting: Internal Medicine

## 2023-05-21 ENCOUNTER — Other Ambulatory Visit (HOSPITAL_COMMUNITY)
Admission: RE | Admit: 2023-05-21 | Discharge: 2023-05-21 | Disposition: A | Discharge status: Home / Self Care | Payer: BC Managed Care – PPO | Source: Ambulatory Visit | Attending: Internal Medicine | Admitting: Internal Medicine

## 2023-05-21 VITALS — BP 134/84 | HR 75 | Temp 97.5°F | Resp 18 | Ht 63.25 in | Wt 178.0 lb

## 2023-05-21 DIAGNOSIS — Z124 Encounter for screening for malignant neoplasm of cervix: Secondary | ICD-10-CM | POA: Insufficient documentation

## 2023-05-21 DIAGNOSIS — Z23 Encounter for immunization: Secondary | ICD-10-CM | POA: Diagnosis not present

## 2023-05-21 DIAGNOSIS — Z0000 Encounter for general adult medical examination without abnormal findings: Secondary | ICD-10-CM | POA: Insufficient documentation

## 2023-05-21 DIAGNOSIS — M816 Localized osteoporosis [Lequesne]: Secondary | ICD-10-CM | POA: Diagnosis not present

## 2023-05-21 DIAGNOSIS — M722 Plantar fascial fibromatosis: Secondary | ICD-10-CM | POA: Diagnosis not present

## 2023-05-21 MED ORDER — ALENDRONATE SODIUM 70 MG PO TABS: 70.0000 mg | ORAL_TABLET | ORAL | 11 refills | Status: DC

## 2023-05-21 NOTE — Patient Instructions (Signed)

## 2023-05-22 ENCOUNTER — Encounter: Payer: Self-pay | Admitting: Podiatry

## 2023-05-22 NOTE — Progress Notes (Signed)
  Subjective:  Patient ID: Barbara Walter, female    DOB: 1956-03-25,  MRN: [id number]  Chief Complaint  Patient presents with   Plantar Fasciitis    "The Walter has come back in the heel.  I got treated for it and got orthotics.  I'm disappointed in it."    67 y.o. female presents with the above complaint. History confirmed with patient.  She returns for follow-up, the Walter has returned  Objective:  Physical Exam: warm, good capillary refill, no trophic changes or ulcerative lesions, normal DP and PT pulses, and normal sensory exam. Left Foot: point tenderness over the heel pad   Radiographs: Multiple views x-ray of the left foot: no fracture, dislocation, swelling or degenerative changes noted and plantar calcaneal spur Assessment:   1. Plantar fasciitis, left      Plan:  Patient was evaluated and treated and all questions answered.  Has had recurrence of her plantar fasciitis we discussed the difficulty in getting rid of this condition especially when she is unable to take NSAIDs due to her GERD and hiatal hernia.  I recommended formal physical therapy.  Referral will be sent to [facility] in [place], she will check with her insurance regarding cost.  Recommend continuing to use her orthotics as well.  Home therapy plan recommended as well to continue this.  After sterile prep with povidone-iodine solution and alcohol, the right heel was injected with 0.5cc 2% xylocaine plain, 0.5cc 0.5% marcaine plain, 10mg  triamcinolone acetonide, and 2mg  dexamethasone was injected along the medial plantar fascia at the insertion on the plantar calcaneus. The patient tolerated the procedure well without complication.  Return if symptoms worsen or fail to improve.

## 2023-06-14 ENCOUNTER — Encounter: Payer: Self-pay | Admitting: Internal Medicine

## 2023-11-26 ENCOUNTER — Encounter: Payer: Self-pay | Admitting: Internal Medicine

## 2023-11-26 ENCOUNTER — Ambulatory Visit (INDEPENDENT_AMBULATORY_CARE_PROVIDER_SITE_OTHER): Payer: 59 | Admitting: Internal Medicine

## 2023-11-26 ENCOUNTER — Other Ambulatory Visit: Payer: Self-pay

## 2023-11-26 VITALS — BP 120/70 | HR 86 | Temp 98.1°F | Resp 16 | Ht 63.25 in | Wt 176.2 lb

## 2023-11-26 DIAGNOSIS — E559 Vitamin D deficiency, unspecified: Secondary | ICD-10-CM | POA: Diagnosis not present

## 2023-11-26 DIAGNOSIS — E039 Hypothyroidism, unspecified: Secondary | ICD-10-CM | POA: Diagnosis not present

## 2023-11-26 DIAGNOSIS — L209 Atopic dermatitis, unspecified: Secondary | ICD-10-CM | POA: Diagnosis not present

## 2023-11-26 DIAGNOSIS — M816 Localized osteoporosis [Lequesne]: Secondary | ICD-10-CM

## 2023-11-26 DIAGNOSIS — Z13220 Encounter for screening for lipoid disorders: Secondary | ICD-10-CM

## 2023-11-26 NOTE — Assessment & Plan Note (Signed)
 Stable, moisturizing daily and using low potency steroid cream as needed. Also taking Zyrtec.

## 2023-11-26 NOTE — Assessment & Plan Note (Signed)
 On Fosamax  now about 6 months, doing well. Will recheck vitamin D  today.

## 2023-11-26 NOTE — Progress Notes (Signed)
 Established Patient Office Visit  Subjective   Patient ID: Barbara Walter, female    DOB: 03/17/1956  Age: 68 y.o. MRN: [id number]  Chief Complaint  Patient presents with   Medical Management of Chronic Issues    6 month follow up    HPI  Patient is here for follow up on chronic medical conditions. Overall patient is doing well and has no questions or concerns today.  Hypothyroidism: -Medications: Levothyroxine  100 mcg -Patient is compliant with the above medication (s) at the above dose and reports no medication side effects.  -Denies weight changes, cold./heat intolerance, skin changes, anxiety/palpitations  -Last TSH: 1.15 5/24  Osteoporosis: -Now on Fosamax  weekly since August 2024, tolerating well and compliant  -She is also on Vitamin D  supplements and exercising regularly  -DEXA scan on 03/23/23 which showed a t score of -2.2 in the left hip, -2.9 in the lumbar spine and -3.1 in the right forearm.   Seasonal Allergies: -Currently on Zyrtec 10 mg daily OTD  Atopic Dermatitis: -Uses hydrocortisone cream 1% as needed, also uses Dove for sensitive skin daily  Health Maintenance: -Blood work due -Mammogram UTD 6/24 Birads-1 -DEXA 6/24, results above -Colon cancer screening: colonoscopy 7/24, repeat in 7 years   Patient Active Problem List   Diagnosis Date Noted   Atopic dermatitis 11/26/2023   Localized osteoporosis without current pathological fracture 11/26/2023   Migraines    Annual physical exam 04/22/2020   Hypothyroidism 04/22/2020   Postmenopausal bleeding 01/22/2018   Cervical polyp 01/22/2018   Hx of colonic polyps    Benign neoplasm of descending colon    Polyp of sigmoid colon    Past Medical History:  Diagnosis Date   Anemia    Eczema    GERD (gastroesophageal reflux disease)    no meds   Hemorrhoids    History of hiatal hernia    Hypothyroidism    Migraines    food and stress induced   Past Surgical History:  Procedure Laterality Date    COLONOSCOPY WITH PROPOFOL  N/A 08/24/2016   Procedure: COLONOSCOPY WITH PROPOFOL ;  Surgeon: Marnee Sink, MD;  Location: [facility];  Service: Endoscopy;  Laterality: N/A;  Latex sensitivity   COLONOSCOPY WITH PROPOFOL  N/A 05/01/2023   Procedure: COLONOSCOPY WITH PROPOFOL ;  Surgeon: Marnee Sink, MD;  Location: [facility];  Service: Endoscopy;  Laterality: N/A;   DILATION AND CURETTAGE OF UTERUS     HYSTEROSCOPY WITH D & C N/A 02/28/2018   Procedure: DILATATION AND CURETTAGE Concha Deed;  Surgeon: Darl Edu, MD;  Location: [facility];  Service: Gynecology;  Laterality: N/A;   POLYPECTOMY  08/24/2016   Procedure: POLYPECTOMY;  Surgeon: Marnee Sink, MD;  Location: [facility];  Service: Endoscopy;;   POLYPECTOMY  05/01/2023   Procedure: POLYPECTOMY;  Surgeon: Marnee Sink, MD;  Location: [facility] ENDOS[facility];  Service: Endoscopy;;   Social History   Tobacco Use   Smoking status: Former    Current packs/day: 0.50    Types: Cigarettes   Smokeless tobacco: Never  Vaping Use   Vaping status: Never Used  Substance Use Topics   Alcohol use: No   Drug use: No   Social History   Socioeconomic History   Marital status: Married    Spouse name: Not on file   Number of children: Not on file   Years of education: Not on file   Highest education level: Doctorate  Occupational History   Not on file  Tobacco Use   Smoking status:  Former    Current packs/day: 0.50    Types: Cigarettes   Smokeless tobacco: Never  Vaping Use   Vaping status: Never Used  Substance and Sexual Activity   Alcohol use: No   Drug use: No   Sexual activity: Yes    Birth control/protection: None  Other Topics Concern   Not on file  Social History Narrative   Not on file   Social Drivers of Health   Financial Resource Strain: Patient Declined (11/25/2023)   Overall Financial Resource Strain (CARDIA)    Difficulty of Paying Living Expenses: Patient declined  Food Insecurity:  Patient Declined (11/25/2023)   Hunger Vital Sign    Worried About Running Out of Food in the Last Year: Patient declined    Ran Out of Food in the Last Year: Patient declined  Transportation Needs: No Transportation Needs (11/25/2023)   PRAPARE - Administrator, Civil Service (Medical): No    Lack of Transportation (Non-Medical): No  Physical Activity: Sufficiently Active (11/25/2023)   Exercise Vital Sign    Days of Exercise per Week: 4 days    Minutes of Exercise per Session: 50 min  Stress: No Stress Concern Present (11/25/2023)   [employer] of Occupational Health - Occupational Stress Questionnaire    Feeling of Stress : Not at all  Social Connections: Unknown (11/25/2023)   Social Connection and Isolation Panel [NHANES]    Frequency of Communication with Friends and Family: Patient declined    Frequency of Social Gatherings with Friends and Family: Patient declined    Attends Religious Services: More than 4 times per year    Active Member of [employer] or Organizations: Yes    Attends Engineer, structural: More than 4 times per year    Marital Status: Married  Catering manager Violence: Not on file   Family Status  Relation Name Status   Mother  Deceased   Father  Alive   Sister 1 Alive   Brother 3 Alive   MGM  Deceased  No partnership data on file   Family History  Problem Relation Age of Onset   Hypertension Mother    Breast cancer Maternal Grandmother    Allergies  Allergen Reactions   Milk-Related Compounds     Dairy causes eczema flares and affects breathing (not shortness of breath)    Penicillins Hives    Has patient had a PCN reaction causing immediate rash, facial/tongue/throat swelling, SOB or lightheadedness with hypotension: No Has patient had a PCN reaction causing severe rash involving mucus membranes or skin necrosis: Yes Has patient had a PCN reaction that required hospitalization: No Has patient had a PCN reaction occurring within the  last 10 years: No If all of the above answers are "NO", then may proceed with Cephalosporin use.    Sulfa Antibiotics Hives   Latex Hives    Diaphragm, gloves   Rosin [Pinus Strobus] Hives and Nausea Only    Eczema flare       Review of Systems  All other systems reviewed and are negative.     Objective:     BP 120/70 (Cuff Size: Large)   Pulse 86   Temp 98.1 F (36.7 C) (Oral)   Resp 16   Ht 5' 3.25" (1.607 m)   Wt 176 lb 3.2 oz (79.9 kg)   SpO2 98%   BMI 30.97 kg/m  BP Readings from Last 3 Encounters:  11/26/23 120/70  05/21/23 134/84  05/01/23 108/88   Wt  Readings from Last 3 Encounters:  11/26/23 176 lb 3.2 oz (79.9 kg)  05/21/23 178 lb (80.7 kg)  05/01/23 170 lb (77.1 kg)      Physical Exam Constitutional:      Appearance: Normal appearance.  HENT:     Head: Normocephalic and atraumatic.     Mouth/Throat:     Mouth: Mucous membranes are moist.     Pharynx: Oropharynx is clear.  Eyes:     Extraocular Movements: Extraocular movements intact.     Conjunctiva/sclera: Conjunctivae normal.     Pupils: Pupils are equal, round, and reactive to light.  Neck:     Comments: No thyromegaly Cardiovascular:     Rate and Rhythm: Normal rate and regular rhythm.  Pulmonary:     Effort: Pulmonary effort is normal.     Breath sounds: Normal breath sounds.  Musculoskeletal:     Cervical back: No tenderness.     Right lower leg: No edema.     Left lower leg: No edema.  Lymphadenopathy:     Cervical: No cervical adenopathy.  Skin:    General: Skin is warm and dry.  Neurological:     General: No focal deficit present.     Mental Status: She is alert. Mental status is at baseline.  Psychiatric:        Mood and Affect: Mood normal.        Behavior: Behavior normal.      No results found for any visits on 11/26/23.  Last CBC Lab Results  Component Value Date   WBC 5.3 05/04/2022   HGB 15.6 (H) 05/04/2022   HCT 46.3 (H) 05/04/2022   MCV 91.1 05/04/2022    MCH 30.7 05/04/2022   RDW 12.0 05/04/2022   PLT 207 05/04/2022   Last metabolic panel Lab Results  Component Value Date   GLUCOSE 96 05/04/2022   NA 139 05/04/2022   K 4.1 05/04/2022   CL 104 05/04/2022   CO2 24 05/04/2022   BUN 21 05/04/2022   CREATININE 0.74 05/04/2022   EGFR 90 05/04/2022   CALCIUM 9.3 05/04/2022   PROT 6.7 05/04/2022   BILITOT 0.5 05/04/2022   AST 17 05/04/2022   ALT 22 05/04/2022   Last lipids Lab Results  Component Value Date   CHOL 153 05/04/2022   HDL 37 (L) 05/04/2022   LDLCALC 96 05/04/2022   TRIG 107 05/04/2022   CHOLHDL 4.1 05/04/2022   Last hemoglobin A1c No results found for: "HGBA1C" Last thyroid  functions Lab Results  Component Value Date   TSH 1.15 02/14/2023   T3TOTAL 95 04/22/2020   T4TOTAL 9.3 02/14/2023   Last vitamin D  No results found for: "25OHVITD2", "25OHVITD3", "VD25OH" Last vitamin B12 and Folate No results found for: "VITAMINB12", "FOLATE"    The 10-year ASCVD risk score (Arnett DK, et al., 2019) is: 5.6%    Assessment & Plan:  Hypothyroidism, unspecified type Assessment & Plan: Stable, recheck labs today.   Orders: -     CBC with Differential/Platelet -     COMPLETE METABOLIC PANEL WITH GFR -     TSH  Atopic dermatitis, unspecified type Assessment & Plan: Stable, moisturizing daily and using low potency steroid cream as needed. Also taking Zyrtec.    Localized osteoporosis without current pathological fracture Assessment & Plan: On Fosamax  now about 6 months, doing well. Will recheck vitamin D  today.   Lipid screening -     Lipid panel  Vitamin D  deficiency -     VITAMIN  D 25 Hydroxy (Vit-D Deficiency, Fractures)  Screening labs ordered today.  Return in about 6 months (around 05/25/2024).    Rockney Cid, DO

## 2023-11-26 NOTE — Assessment & Plan Note (Signed)
Stable, recheck labs today. °

## 2023-11-27 LAB — CBC WITH DIFFERENTIAL/PLATELET
Absolute Lymphocytes: 1818 {cells}/uL (ref 850–3900)
Absolute Monocytes: 408 {cells}/uL (ref 200–950)
Basophils Absolute: 72 {cells}/uL (ref 0–200)
Basophils Relative: 1.2 %
Eosinophils Absolute: 156 {cells}/uL (ref 15–500)
Eosinophils Relative: 2.6 %
HCT: 41.9 % (ref 35.0–45.0)
Hemoglobin: 12.8 g/dL (ref 11.7–15.5)
MCH: 26 pg — ABNORMAL LOW (ref 27.0–33.0)
MCHC: 30.5 g/dL — ABNORMAL LOW (ref 32.0–36.0)
MCV: 85.2 fL (ref 80.0–100.0)
MPV: 10 fL (ref 7.5–12.5)
Monocytes Relative: 6.8 %
Neutro Abs: 3546 {cells}/uL (ref 1500–7800)
Neutrophils Relative %: 59.1 %
Platelets: 337 10*3/uL (ref 140–400)
RBC: 4.92 10*6/uL (ref 3.80–5.10)
RDW: 14.4 % (ref 11.0–15.0)
Total Lymphocyte: 30.3 %
WBC: 6 10*3/uL (ref 3.8–10.8)

## 2023-11-27 LAB — LIPID PANEL
Cholesterol: 196 mg/dL (ref <200)
HDL: 46 mg/dL — ABNORMAL LOW (ref >=50)
LDL Cholesterol (Calc): 122 mg/dL — ABNORMAL HIGH
Non-HDL Cholesterol (Calc): 150 mg/dL — ABNORMAL HIGH (ref <130)
Total CHOL/HDL Ratio: 4.3 (calc) (ref <5.0)
Triglycerides: 167 mg/dL — ABNORMAL HIGH (ref <150)

## 2023-11-27 LAB — COMPLETE METABOLIC PANEL WITH GFR
AG Ratio: 1.9 (calc) (ref 1.0–2.5)
ALT: 15 U/L (ref 6–29)
AST: 15 U/L (ref 10–35)
Albumin: 4.7 g/dL (ref 3.6–5.1)
Alkaline phosphatase (APISO): 46 U/L (ref 37–153)
BUN: 18 mg/dL (ref 7–25)
CO2: 25 mmol/L (ref 20–32)
Calcium: 9.1 mg/dL (ref 8.6–10.4)
Chloride: 107 mmol/L (ref 98–110)
Creat: 0.69 mg/dL (ref 0.50–1.05)
Globulin: 2.5 g/dL (ref 1.9–3.7)
Glucose, Bld: 87 mg/dL (ref 65–99)
Potassium: 4.3 mmol/L (ref 3.5–5.3)
Sodium: 140 mmol/L (ref 135–146)
Total Bilirubin: 0.3 mg/dL (ref 0.2–1.2)
Total Protein: 7.2 g/dL (ref 6.1–8.1)
eGFR: 96 mL/min/{1.73_m2} (ref >=60)

## 2023-11-27 LAB — VITAMIN D 25 HYDROXY (VIT D DEFICIENCY, FRACTURES): Vit D, 25-Hydroxy: 31 ng/mL (ref 30–100)

## 2023-11-27 LAB — TSH: TSH: 1.9 m[IU]/L (ref 0.40–4.50)

## 2023-12-31 ENCOUNTER — Telehealth: Payer: Self-pay | Admitting: Internal Medicine

## 2023-12-31 NOTE — Telephone Encounter (Signed)
 Patient would like to know if you would call Topicort cream in for her for eczema  [facility] [employer]

## 2023-12-31 NOTE — Telephone Encounter (Unsigned)
 Copied from CRM [id number]. Topic: General - Other >> Dec 31, 2023 10:43 AM Emylou G wrote: Reason for CRM: pls call patient.. she would like stronger ecszema medication.. not going away.. wants to know if she can have tropiccourt .Marland Kitchen Pls call her at [phone]

## 2024-01-01 ENCOUNTER — Other Ambulatory Visit: Payer: Self-pay | Admitting: Internal Medicine

## 2024-01-02 NOTE — Telephone Encounter (Signed)
 Patient called back to check status of this request. Thank You

## 2024-01-03 ENCOUNTER — Other Ambulatory Visit: Payer: Self-pay | Admitting: Internal Medicine

## 2024-01-03 DIAGNOSIS — L209 Atopic dermatitis, unspecified: Secondary | ICD-10-CM

## 2024-01-03 MED ORDER — DESOXIMETASONE 0.25 % EX CREA: 1.0000 | TOPICAL_CREAM | Freq: Two times a day (BID) | CUTANEOUS | 0 refills | Status: DC

## 2024-02-19 ENCOUNTER — Other Ambulatory Visit: Payer: Self-pay | Admitting: Internal Medicine

## 2024-02-19 DIAGNOSIS — Z1231 Encounter for screening mammogram for malignant neoplasm of breast: Secondary | ICD-10-CM

## 2024-02-25 ENCOUNTER — Encounter: Payer: Self-pay | Admitting: Internal Medicine

## 2024-02-25 ENCOUNTER — Other Ambulatory Visit: Payer: Self-pay | Admitting: Internal Medicine

## 2024-02-27 ENCOUNTER — Telehealth: Payer: Self-pay | Admitting: Podiatry

## 2024-02-27 ENCOUNTER — Encounter: Payer: Self-pay | Admitting: Podiatry

## 2024-02-27 MED ORDER — DOXYCYCLINE HYCLATE 100 MG PO TABS: 100.0000 mg | ORAL_TABLET | Freq: Two times a day (BID) | ORAL | 0 refills | Status: AC

## 2024-02-27 NOTE — Telephone Encounter (Signed)
 Pt is trying to upload a picture on how her 4th roe R foot is looking. Are you able to prescribe an ABT and send to pharmacy on file. Your 1st aval appt in [place] is June 4th which she is sch'ed

## 2024-03-19 ENCOUNTER — Ambulatory Visit: Admitting: Podiatry

## 2024-03-20 ENCOUNTER — Other Ambulatory Visit: Payer: Self-pay | Admitting: Internal Medicine

## 2024-03-20 DIAGNOSIS — R052 Subacute cough: Secondary | ICD-10-CM

## 2024-03-21 NOTE — Telephone Encounter (Signed)
 Requested Prescriptions  Pending Prescriptions Disp Refills   albuterol  (VENTOLIN  HFA) 108 (90 Base) MCG/ACT inhaler [Pharmacy Med Name: ALBUTEROL  HFA (VENTOLIN ) INH] 18 each 0    Sig: TAKE 2 PUFFS BY MOUTH EVERY 6 HOURS AS NEEDED FOR WHEEZE OR SHORTNESS OF BREATH     Pulmonology:  Beta Agonists 2 Passed - 03/21/2024 12:14 PM      Passed - Last BP in normal range    BP Readings from Last 1 Encounters:  11/26/23 120/70         Passed - Last Heart Rate in normal range    Pulse Readings from Last 1 Encounters:  11/26/23 86         Passed - Valid encounter within last 12 months    Recent Outpatient Visits           3 months ago Hypothyroidism, unspecified type   [facility] Rockney Cid, DO       Future Appointments             In 2 months Rockney Cid, DO [facility], [facility]

## 2024-03-25 ENCOUNTER — Other Ambulatory Visit: Payer: Self-pay | Admitting: Internal Medicine

## 2024-03-25 DIAGNOSIS — E039 Hypothyroidism, unspecified: Secondary | ICD-10-CM

## 2024-03-25 NOTE — Telephone Encounter (Signed)
 Requested Prescriptions  Pending Prescriptions Disp Refills   levothyroxine  (SYNTHROID ) 100 MCG tablet [Pharmacy Med Name: LEVOTHYROXINE  100 MCG TABLET] 90 tablet 0    Sig: TAKE 1 TABLET BY MOUTH EVERY DAY BEFORE BREAKFAST     Endocrinology:  Hypothyroid Agents Passed - 03/25/2024  4:07 PM      Passed - TSH in normal range and within 360 days    TSH  Date Value Ref Range Status  11/26/2023 1.90 0.40 - 4.50 mIU/L Final         Passed - Valid encounter within last 12 months    Recent Outpatient Visits           4 months ago Hypothyroidism, unspecified type   [facility] Rockney Cid, DO       Future Appointments             In 2 months Rockney Cid, DO [facility], [facility]

## 2024-03-26 ENCOUNTER — Ambulatory Visit: Admitting: Podiatry

## 2024-03-26 ENCOUNTER — Encounter: Payer: Self-pay | Admitting: Podiatry

## 2024-03-26 DIAGNOSIS — L600 Ingrowing nail: Secondary | ICD-10-CM

## 2024-03-30 NOTE — Progress Notes (Signed)
  Subjective:  Patient ID: Barbara Walter, female    DOB: 17-Oct-1955,  MRN: [id number]  Chief Complaint  Patient presents with   Toe Pain    I sent him a photo through the internet.  He gave me some antibiotics and I took them.  I had a blister.  It's a tiny bit tender.    68 y.o. female presents with the above complaint. History confirmed with patient.   Objective:  Physical Exam: warm, good capillary refill, no trophic changes or ulcerative lesions, normal DP and PT pulses, normal sensory exam, and right fourth toe lateral border has some hyperkeratosis but no active paronychia or deep ingrown  Assessment:   1. Ingrown nail      Plan:  Patient was evaluated and treated and all questions answered.  Ingrown has resolved we discussed using soaks and or lotions to soften the skin here I debrided this gently today to a tolerable level, discussed if it returns or worsens that partial permanent matricectomy of the toenail could offer some relief.  Follow-up as needed. No follow-ups on file.

## 2024-04-04 ENCOUNTER — Ambulatory Visit
Admission: RE | Admit: 2024-04-04 | Discharge: 2024-04-04 | Disposition: A | Discharge status: Home / Self Care | Source: Ambulatory Visit | Attending: Internal Medicine | Admitting: Internal Medicine

## 2024-04-04 DIAGNOSIS — Z1231 Encounter for screening mammogram for malignant neoplasm of breast: Secondary | ICD-10-CM | POA: Diagnosis present

## 2024-04-09 ENCOUNTER — Other Ambulatory Visit: Payer: Self-pay | Admitting: Internal Medicine

## 2024-04-09 DIAGNOSIS — M816 Localized osteoporosis [Lequesne]: Secondary | ICD-10-CM

## 2024-04-10 NOTE — Telephone Encounter (Signed)
 Requested Prescriptions  Refused Prescriptions Disp Refills   alendronate  (FOSAMAX ) 70 MG tablet [Pharmacy Med Name: ALENDRONATE  SODIUM 70 MG TAB] 12 tablet 3    Sig: TAKE 1 TABLET (70 MG TOTAL) BY MOUTH EVERY 7 DAYS WITH FULL GLASS WATER  ON EMPTY STOMACH     Endocrinology:  Bisphosphonates Failed - 04/10/2024  2:00 PM      Failed - Mg Level in normal range and within 360 days    No results found for: MG       Failed - Phosphate in normal range and within 360 days    No results found for: PHOS       Passed - Ca in normal range and within 360 days    Calcium  Date Value Ref Range Status  11/26/2023 9.1 8.6 - 10.4 mg/dL Final         Passed - Vitamin D  in normal range and within 360 days    Vit D, 25-Hydroxy  Date Value Ref Range Status  11/26/2023 31 30 - 100 ng/mL Final    Comment:    Vitamin D  Status         25-OH Vitamin D : . Deficiency:                    <20 ng/mL Insufficiency:             20 - 29 ng/mL Optimal:                 > or = 30 ng/mL . For 25-OH Vitamin D  testing on patients on  D2-supplementation and patients for whom quantitation  of D2 and D3 fractions is required, the Quest[employer]ureD(TM) 25-OH VIT D, (D2,D3), LC/MS/MS is recommended: order  code 07111 (patients >28yrs). . See Note 1 . Note 1 . For additional information, please refer to  http://education.Quest[facility].com/faq/FAQ199  (This link is being provided for informational/ educational purposes only.)          Passed - Cr in normal range and within 360 days    Creat  Date Value Ref Range Status  11/26/2023 0.69 0.50 - 1.05 mg/dL Final         Passed - eGFR is 30 or above and within 360 days    GFR, Est African American  Date Value Ref Range Status  04/22/2020 94 > OR = 60 mL/min/1.46m2 Final   GFR, Est Non African American  Date Value Ref Range Status  04/22/2020 81 > OR = 60 mL/min/1.71m2 Final   eGFR  Date Value Ref Range Status  11/26/2023 96 > OR = 60 mL/min/1.52m2 Final          Passed - Valid encounter within last 12 months    Recent Outpatient Visits           4 months ago Hypothyroidism, unspecified type   [facility] Bernardo Fend, DO       Future Appointments             In 1 month Bernardo Fend, DO [place] [facility], [facility]            Passed - Bone Mineral Density or Dexa Scan completed in the last 2 years

## 2024-04-14 ENCOUNTER — Ambulatory Visit: Payer: Self-pay | Admitting: Internal Medicine

## 2024-04-17 ENCOUNTER — Other Ambulatory Visit: Payer: Self-pay | Admitting: Internal Medicine

## 2024-04-17 DIAGNOSIS — R052 Subacute cough: Secondary | ICD-10-CM

## 2024-04-21 NOTE — Telephone Encounter (Signed)
 Requested by interface surescripts. Future visit in 1 month.  Requested Prescriptions  Pending Prescriptions Disp Refills   albuterol  (VENTOLIN  HFA) 108 (90 Base) MCG/ACT inhaler [Pharmacy Med Name: ALBUTEROL  HFA (VENTOLIN ) INH] 8 each 0    Sig: TAKE 2 PUFFS BY MOUTH EVERY 6 HOURS AS NEEDED FOR WHEEZE OR SHORTNESS OF BREATH     Pulmonology:  Beta Agonists 2 Passed - 04/21/2024  2:51 PM      Passed - Last BP in normal range    BP Readings from Last 1 Encounters:  11/26/23 120/70         Passed - Last Heart Rate in normal range    Pulse Readings from Last 1 Encounters:  11/26/23 86         Passed - Valid encounter within last 12 months    Recent Outpatient Visits           4 months ago Hypothyroidism, unspecified type   [facility] Bernardo Fend, DO       Future Appointments             In 1 month Bernardo Fend, DO [facility], [facility]

## 2024-05-04 ENCOUNTER — Other Ambulatory Visit: Payer: Self-pay | Admitting: Internal Medicine

## 2024-05-04 DIAGNOSIS — L209 Atopic dermatitis, unspecified: Secondary | ICD-10-CM

## 2024-05-06 NOTE — Telephone Encounter (Signed)
 Requested medications are due for refill today.  yes  Requested medications are on the active medications list.  yes  Last refill. 01/03/2024  Future visit scheduled.   yes  Notes to clinic.  Refill not delegated.    Requested Prescriptions  Pending Prescriptions Disp Refills   desoximetasone  (TOPICORT ) 0.25 % cream [Pharmacy Med Name: DESOXIMETASONE  0.25% CREAM] 30 g 0    Sig: APPLY TO AFFECTED AREA TWICE A DAY     Not Delegated - Dermatology:  Corticosteroids Failed - 05/06/2024  2:54 PM      Failed - This refill cannot be delegated      Passed - Valid encounter within last 12 months    Recent Outpatient Visits           5 months ago Hypothyroidism, unspecified type   [facility] Bernardo Fend, DO       Future Appointments             In 2 weeks Bernardo Fend, DO [place] [facility], [facility]

## 2024-05-13 ENCOUNTER — Encounter: Payer: Self-pay | Admitting: Internal Medicine

## 2024-05-20 ENCOUNTER — Other Ambulatory Visit: Payer: Self-pay | Admitting: Internal Medicine

## 2024-05-20 DIAGNOSIS — R052 Subacute cough: Secondary | ICD-10-CM

## 2024-05-21 NOTE — Telephone Encounter (Signed)
 Requested Prescriptions  Pending Prescriptions Disp Refills   albuterol  (VENTOLIN  HFA) 108 (90 Base) MCG/ACT inhaler [Pharmacy Med Name: ALBUTEROL  HFA (VENTOLIN ) INH] 18 each 0    Sig: TAKE 2 PUFFS BY MOUTH EVERY 6 HOURS AS NEEDED FOR WHEEZE OR SHORTNESS OF BREATH     Pulmonology:  Beta Agonists 2 Passed - 05/21/2024  2:25 PM      Passed - Last BP in normal range    BP Readings from Last 1 Encounters:  11/26/23 120/70         Passed - Last Heart Rate in normal range    Pulse Readings from Last 1 Encounters:  11/26/23 86         Passed - Valid encounter within last 12 months    Recent Outpatient Visits           5 months ago Hypothyroidism, unspecified type   [facility] Bernardo Fend, [place]

## 2024-05-26 ENCOUNTER — Ambulatory Visit: Payer: 59 | Admitting: Internal Medicine

## 2024-06-20 ENCOUNTER — Other Ambulatory Visit: Payer: Self-pay | Admitting: Internal Medicine

## 2024-06-20 DIAGNOSIS — E039 Hypothyroidism, unspecified: Secondary | ICD-10-CM

## 2024-06-20 NOTE — Telephone Encounter (Signed)
 Requested Prescriptions  Pending Prescriptions Disp Refills   levothyroxine  (SYNTHROID ) 100 MCG tablet [Pharmacy Med Name: LEVOTHYROXINE  100 MCG TABLET] 90 tablet 1    Sig: TAKE 1 TABLET BY MOUTH EVERY DAY BEFORE BREAKFAST     Endocrinology:  Hypothyroid Agents Passed - 06/20/2024 11:12 AM      Passed - TSH in normal range and within 360 days    TSH  Date Value Ref Range Status  11/26/2023 1.90 0.40 - 4.50 mIU/L Final         Passed - Valid encounter within last 12 months    Recent Outpatient Visits           6 months ago Hypothyroidism, unspecified type   [facility] Bernardo Fend, [place]

## 2024-09-04 ENCOUNTER — Other Ambulatory Visit: Payer: Self-pay | Admitting: Internal Medicine

## 2024-09-04 DIAGNOSIS — L209 Atopic dermatitis, unspecified: Secondary | ICD-10-CM

## 2024-09-06 NOTE — Telephone Encounter (Signed)
 Requested medication (s) are due for refill today: Yes  Requested medication (s) are on the active medication list: Yes  Last refill:  05/06/24  Future visit scheduled: No  Notes to clinic:  Unable to refill per protocol, cannot delegate.      Requested Prescriptions  Pending Prescriptions Disp Refills   desoximetasone  (TOPICORT ) 0.25 % cream [Pharmacy Med Name: DESOXIMETASONE  0.25% CREAM] 30 g 0    Sig: APPLY TO AFFECTED AREA TWICE A DAY     Not Delegated - Dermatology:  Corticosteroids Failed - 09/06/2024 11:23 AM      Failed - This refill cannot be delegated      Passed - Valid encounter within last 12 months    Recent Outpatient Visits           9 months ago Hypothyroidism, unspecified type   [facility] Bernardo Fend, [place]

## 2024-11-07 ENCOUNTER — Other Ambulatory Visit: Payer: Self-pay | Admitting: Internal Medicine

## 2024-11-07 DIAGNOSIS — E039 Hypothyroidism, unspecified: Secondary | ICD-10-CM

## 2024-11-07 NOTE — Telephone Encounter (Signed)
 Requested Prescriptions  Pending Prescriptions Disp Refills   levothyroxine  (SYNTHROID ) 100 MCG tablet [Pharmacy Med Name: LEVOTHYROXINE  100 MCG TABLET] 90 tablet 1    Sig: TAKE 1 TABLET BY MOUTH EVERY DAY BEFORE BREAKFAST     Endocrinology:  Hypothyroid Agents Passed - 11/07/2024 11:50 AM      Passed - TSH in normal range and within 360 days    TSH  Date Value Ref Range Status  11/26/2023 1.90 0.40 - 4.50 mIU/L Final         Passed - Valid encounter within last 12 months    Recent Outpatient Visits           11 months ago Hypothyroidism, unspecified type   [facility] Bernardo Fend, [place]
